# Patient Record
Sex: Female | Born: 1990 | Race: Asian | Hispanic: No | Marital: Single | Smoking: Never smoker
Health system: Southern US, Community
[De-identification: ages and names within clinical notes are randomized; demographics above are authoritative.]

## PROBLEM LIST (undated history)

## (undated) ENCOUNTER — Inpatient Hospital Stay (HOSPITAL_COMMUNITY): Payer: Self-pay

## (undated) DIAGNOSIS — K529 Noninfective gastroenteritis and colitis, unspecified: Secondary | ICD-10-CM

## (undated) DIAGNOSIS — R51 Headache: Secondary | ICD-10-CM

## (undated) HISTORY — DX: Headache: R51

## (undated) HISTORY — PX: WISDOM TOOTH EXTRACTION: SHX21

---
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
## 2008-01-24 ENCOUNTER — Emergency Department (HOSPITAL_COMMUNITY): Admission: EM | Admit: 2008-01-24 | Discharge: 2008-01-24 | Payer: Self-pay | Admitting: Emergency Medicine

## 2008-03-17 ENCOUNTER — Ambulatory Visit (HOSPITAL_COMMUNITY): Admission: RE | Admit: 2008-03-17 | Discharge: 2008-03-17 | Payer: Self-pay | Admitting: Family Medicine

## 2008-03-20 ENCOUNTER — Inpatient Hospital Stay (HOSPITAL_COMMUNITY): Admission: AD | Admit: 2008-03-20 | Discharge: 2008-03-23 | Payer: Self-pay | Admitting: Obstetrics and Gynecology

## 2008-03-20 ENCOUNTER — Encounter: Payer: Self-pay | Admitting: Obstetrics and Gynecology

## 2008-03-20 ENCOUNTER — Ambulatory Visit: Payer: Self-pay | Admitting: Obstetrics and Gynecology

## 2008-04-14 ENCOUNTER — Ambulatory Visit: Payer: Self-pay | Admitting: Obstetrics & Gynecology

## 2008-08-19 ENCOUNTER — Emergency Department (HOSPITAL_COMMUNITY): Admission: EM | Admit: 2008-08-19 | Discharge: 2008-08-19 | Payer: Self-pay | Admitting: Emergency Medicine

## 2009-02-04 ENCOUNTER — Emergency Department (HOSPITAL_COMMUNITY): Admission: EM | Admit: 2009-02-04 | Discharge: 2009-02-04 | Payer: Self-pay | Admitting: Emergency Medicine

## 2009-12-24 ENCOUNTER — Emergency Department (HOSPITAL_COMMUNITY): Admission: EM | Admit: 2009-12-24 | Discharge: 2009-12-25 | Payer: Self-pay | Admitting: Emergency Medicine

## 2010-06-23 ENCOUNTER — Emergency Department (HOSPITAL_COMMUNITY): Admission: EM | Admit: 2010-06-23 | Discharge: 2010-06-23 | Payer: Self-pay | Admitting: Emergency Medicine

## 2011-03-31 LAB — DIFFERENTIAL
Basophils Absolute: 0 10*3/uL (ref 0.0–0.1)
Lymphocytes Relative: 13 % (ref 12–46)
Lymphs Abs: 0.9 10*3/uL (ref 0.7–4.0)
Monocytes Relative: 9 % (ref 3–12)
Neutro Abs: 5.6 10*3/uL (ref 1.7–7.7)
Neutrophils Relative %: 78 % — ABNORMAL HIGH (ref 43–77)

## 2011-03-31 LAB — POCT I-STAT, CHEM 8
BUN: 6 mg/dL (ref 6–23)
Chloride: 107 mEq/L (ref 96–112)
HCT: 43 % (ref 36.0–46.0)
Potassium: 3.9 mEq/L (ref 3.5–5.1)

## 2011-03-31 LAB — URINALYSIS, ROUTINE W REFLEX MICROSCOPIC
Nitrite: NEGATIVE
Specific Gravity, Urine: 1.01 (ref 1.005–1.030)
Urobilinogen, UA: 0.2 mg/dL (ref 0.0–1.0)
pH: 6.5 (ref 5.0–8.0)

## 2011-03-31 LAB — CBC
Hemoglobin: 13.7 g/dL (ref 12.0–15.0)
Platelets: 210 10*3/uL (ref 150–400)
RBC: 4.77 MIL/uL (ref 3.87–5.11)

## 2011-03-31 LAB — POCT PREGNANCY, URINE: Preg Test, Ur: NEGATIVE

## 2011-05-13 NOTE — Group Therapy Note (Signed)
NAMEMAHAILA, TISCHER NO.:  [id number]   MEDICAL RECORD NO.:  [id number]          PATIENT TYPE:  WOC   LOCATION:  [facility]                   FACILITY:  [facility]   PHYSICIAN:  Sid Falcon, CNM  DATE OF BIRTH:  April 03, 1991   DATE OF SERVICE:  04/14/2008                                  CLINIC NOTE   The patient is an 20 year old gravida 1, para 1, status post lower  transverse cesarean section on March 18, 2008.  The patient shaved prior  to admission for cesarean section and developed a right labial  cellulitis.  The patient is here for check of this lesion site.  The  patient is breastfeeding, is not currently on birth control at this  time.  The patient has not been sexually active since discharge and  desires IUD for birth control.  She has a postpartum visit scheduled in  May.  The patient also complains of lightheadedness and also stated not  eating well at this time due to note being hungry and reports that there  is a resolution of the right labial cellulitis with no signs of  infection at this time.   PHYSICAL EXAMINATION:  GENERAL:  The patient was alert and oriented x3,  no acute signs of distress.  ABDOMEN:  Steri-Strips still in place, removed without difficulty.  Incisional site; no signs of infection.  No abnormal odor, no abnormal  discharge, and no redness.  Abdomen nontender with palpation.  The  patient reports that she has some incisional pain with increased  movement, however, bleeding is normal.  GENITOURINARY:  Right labia was assessed and appeared normal with no  redness and no edema.  No abnormal signs today.   ASSESSMENT:  Post wound check to lightheadedness.   PLAN:  The patient is advised to eat more frequent meals with breast  feeding and also informed that incisional pain is normal post cesarean  section.  She is to avoid heavy lifting and increased movement.  Allow  self approximately 6 weeks to heal status post cesarean  section.  Keep  postpartum appointment in May and avoid intercourse until that visit and  follow-up as needed.      Sid Falcon, CNM     WM/MEDQ  D:  04/14/2008  T:  04/14/2008  Job:  [id number]

## 2011-05-13 NOTE — Discharge Summary (Signed)
NAMEJHANAE, JASKOWIAK NO.:  [id number]   MEDICAL RECORD NO.:  [id number]           PATIENT TYPE:   LOCATION:                                [facility]   PHYSICIAN:  Phil D. Okey Dupre, M.D.     DATE OF BIRTH:  1991/12/14   DATE OF ADMISSION:  DATE OF DISCHARGE:                               DISCHARGE SUMMARY   The patient is an 20 year old G1, P0, admitted for spontaneous rupture  of membranes and onset of labor at 39 weeks and 3 days of estimated  gestational age.  However, a bedtime ultrasound showed that the baby was  in a breech position and so a primary low-transverse cesarean section  was performed.  The patient went on to have a viable female infant with  Apgars of 2 at 1 minute and 8 at 5 minutes under spinal anesthesia.  Estimated blood loss 600 mL.  The days following delivery, the patient  developed a right labial cellulitis, was started on Keflex 500 mg b.i.d.  Prior to discharge, the cellulitic area was draining on its own.  So a  wound culture was done and sent.  We will follow up with wound culture  and look for sensitivities and change antibiotics as necessary.  The  patient is breast feeding, using an IUD for contraception.  Significant  prenatal labs include blood type A+, antibody negative, hepatitis B  surface antigen negative, syphilis, HIV, GC and Chlamydia all negative,  GBS negative, rubella immune.  Prenatal procedures none.  Intrapartum  procedures include low cervical transverse cesarean.  Please see  operative report for details.  Postpartum procedures include  antibiotics, Keflex 500 mg 4 times daily. for right labial cellulitis.  Postpartum complications include a right labial cellulitis as mentioned  above.   DISCHARGE DIAGNOSIS:  Term pregnancy delivered and right labial  cellulitis.   DISCHARGE INFORMATION:  The patient was discharged on March 23, 2008, at  11:00 a.m.   ACTIVITY:  The patient was instructed to have no  intercourse for six  weeks, instructed not to lift anything greater than 10 pounds for six  weeks, and instructed not to drive for two weeks.   DIET:  Routine.   MEDICATIONS:  1. Percocet 5/325 mg 1 tab q.6 h. p.r.n. pain.  2. Ibuprofen 600 mg 1 tab q.6 h. p.r.n. pain.  3. Iron 325 mg 1 tab p.o. b.i.d. for anemia.  4. Colace 100 mg 1 tab p.o. b.i.d. as needed for constipation.  5. Prenatal vitamins 1 tab daily while breastfeeding.  6. keflex 500mg  4 times daily   Discharge status is well.  Discharge instructions are routine.  The  patient was discharged home and instructed to follow up on March 30, 2008, in the [facility], and for her cellulitis instructed to follow up  at 6 weeks at the health department.  Discharge hemoglobin 10.6,  discharge hematocrit 30.8 that was on March 21, 2008.  If not previously  mentioned the patient's admission date was March 20, 2008.      Asher Muir, MD  Phil D. Okey Dupre, M.D.  Electronically Signed    SO/MEDQ  D:  03/23/2008  T:  03/24/2008  Job:  [id number]

## 2011-05-13 NOTE — Group Therapy Note (Signed)
Julie Velasquez, KULIK NO.:  [id number]   MEDICAL RECORD NO.:  [id number]          PATIENT TYPE:  WOC   LOCATION:  [facility]                   FACILITY:  [facility]   PHYSICIAN:  Elsie Lincoln, MD      DATE OF BIRTH:  [birth year]   DATE OF SERVICE:                                  CLINIC NOTE   ADDENDUM:  The patient reported migraine history that she has had since  age 20.   PLAN:  1. The patient was given a referral to the [facility] for follow up.  2. A prescription was written for the lightheadedness of ferrous      sulfate 325 mg one p.o. daily no refills.      Eino Farber Jerolyn Center, CNM    ______________________________  Elsie Lincoln, MD    WM/MEDQ  D:  04/14/2008  T:  04/14/2008  Job:  [id number]

## 2011-05-13 NOTE — Op Note (Signed)
Julie Velasquez, Julie Velasquez                ACCOUNT NO.:  [id number]   MEDICAL RECORD NO.:  [id number]          PATIENT TYPE:  INP   LOCATION:  9132                          [facility]ACILITY:  [facility]   PHYSICIAN:  Phil D. Okey Dupre, M.D.     DATE OF BIRTH:  1991/11/25   DATE OF PROCEDURE:  03/20/2008  DATE OF DISCHARGE:                               OPERATIVE REPORT   PREOPERATIVE DIAGNOSES:  1. Intrauterine pregnancy at [redacted] weeks gestation.  2. Julie Velasquez breech presentation.  3. Spontaneous onset of labor with cervical dilatation to 6 cm.   POSTOPERATIVE DIAGNOSES:  1. Intrauterine pregnancy at [redacted] weeks gestation.  2. Julie Velasquez breech presentation.  3. Spontaneous onset of labor with cervical dilatation to 6 cm.   PROCEDURE:  Primary low transverse cesarean section.   SURGEON:  Bettye Boeck. Okey Dupre, MD.   ASSISTANT:  Karlton Lemon, MD.   ANESTHESIA:  Epidural.   FINDINGS:  1. Viable infant female weighing 7 pounds 4 ounces with Apgar's 2 at      one minute, 8 at two minutes, with cord pH of 7.26, in a frank      breech presentation.  2. Clear amniotic fluid.  3. Normal female pelvic anatomy.   ESTIMATED BLOOD LOSS:  600 ml.   DRAINS:  Foley with clear yellow urine.   COMPLICATIONS:  None immediate.   SPECIMENS:  1. Placenta to Labor and Delivery.  2. Cord pH to the Lab, which returned 7.26.  3. Cord blood to the Lab.   INDICATIONS FOR PROCEDURE:  Ms. Warsame is an 20 year old, gravida 1,  para 0, at [redacted] weeks gestation presenting with spontaneous onset of  labor.  After being admitted, the head could not be felt on cervical  examinations and ultrasound was performed.  She was found to be in frank  breech presentation.  The patient was in labor and was dilated to 6 cm  at the time of cesarean section.  The patient was taken to the operating  room for a primary low transverse cesarean section for frank breech  presentation while she was in labor.   DESCRIPTION OF PROCEDURE:  The patient was taken to the  operating room  and after obtaining adequate epidural anesthesia was prepped and draped  in the usual sterile manner in the supine position with left lateral  uterine displacement.  After insuring adequate anesthesia, a  Pfannenstiel skin incision was made using the scalpel.  This incision  was carried down through the subcutaneous tissues using the scalpel.  The rectus fascia was nicked in the midline and the incision was  extended laterally in each direction using the Mayo scissors.  The  rectus muscle was then dissected free of the fascia using both sharp and  blunt dissection.  The rectus muscles were separated with Mayo scissors  in the midline and bluntly.  The parietal peritoneum was identified,  grasped between hemostats, elevated, and entered inferiorly under direct  visualization.  A bladder blade was placed, and a reflection of the  visceral peritoneum superior to the bladder was identified and elevated  and incised  using the Metzenbaum scissors, and this incision was  extended laterally.  A bladder flap was created using blunt dissection  and retracted with the bladder blade.  A low transverse uterine incision  was made using the scalpel and the incision was extended laterally and  superiorly using blunt dissection.  A hand was placed within the uterine  cavity and the infant's buttocks were palpated.  The hand was used to  elevate the infant's buttocks, which were delivered without difficulty.  The buttocks were then rotated and the legs were delivered  atraumatically.  The torso was rotated to the mother's right, and the  infant's right arm was swept down over the front of the baby's body and  delivered.  The infant was then rotated to the left where the infant's  left arm was swept down in front of the infant's body and out.  The  infant's head was then flexed and with fundal pressure delivered  atraumatically.  The infant was bulb suctioned after delivery and the  cord was  clamped.  The cord was clamped x2, incised, and the infant was  handed to the [employer] in attendance.  The placenta was delivered  with cord traction and appeared intact.  The endometrial cavity was  wiped free of any traces of membranes using wet laparotomy sponges.  The  uterine incision was grasped with Allis clamps, and the uterine incision  was closed in one layer of running locking stitch of #0 Vicryl.  The  uterine incision was then inspected and found to have good hemostasis.  The rectus muscles were inspected, and small areas of bleeding were  controlled using the Bovie cautery.  The uterine incision was then  reinspected and found to have good hemostasis.  The rectus fascia was  reapproximated with one suture of #0 Vicryl in a running unlocked  fashion.  Small areas of bleeding in the subcutaneous tissue were  controlled using the Bovie cautery.  The skin was reapproximated with  stainless steel skin staples.  Sponge, needle, and instrument counts  were correct x2.  The patient tolerated the procedure well and went to  the postanesthesia care unit in stable condition.      Karlton Lemon, MD  Electronically Signed     ______________________________  Javier Glazier. Okey Dupre, M.D.    NS/MEDQ  D:  03/20/2008  T:  03/20/2008  Job:  [id number]

## 2011-08-19 ENCOUNTER — Emergency Department (HOSPITAL_COMMUNITY)
Admission: EM | Admit: 2011-08-19 | Discharge: 2011-08-20 | Disposition: A | Discharge status: Home / Self Care | Payer: Self-pay | Attending: Emergency Medicine | Admitting: Emergency Medicine

## 2011-08-19 DIAGNOSIS — R110 Nausea: Secondary | ICD-10-CM | POA: Insufficient documentation

## 2011-08-19 DIAGNOSIS — R000 Tachycardia, unspecified: Secondary | ICD-10-CM | POA: Insufficient documentation

## 2011-08-19 DIAGNOSIS — R1013 Epigastric pain: Secondary | ICD-10-CM | POA: Insufficient documentation

## 2011-08-19 LAB — COMPREHENSIVE METABOLIC PANEL
ALT: 14 U/L (ref 0–35)
Alkaline Phosphatase: 51 U/L (ref 39–117)
CO2: 24 mEq/L (ref 19–32)
Calcium: 9.7 mg/dL (ref 8.4–10.5)
Chloride: 101 mEq/L (ref 96–112)
Creatinine, Ser: 0.53 mg/dL (ref 0.50–1.10)
GFR calc Af Amer: >60 mL/min (ref >60)
GFR calc non Af Amer: >60 mL/min (ref >60)
Sodium: 134 mEq/L — ABNORMAL LOW (ref 135–145)
Total Bilirubin: 1.1 mg/dL (ref 0.3–1.2)

## 2011-08-19 LAB — DIFFERENTIAL
Basophils Relative: 0 % (ref 0–1)
Eosinophils Absolute: 0.1 10*3/uL (ref 0.0–0.7)
Eosinophils Relative: 1 % (ref 0–5)
Lymphocytes Relative: 5 % — ABNORMAL LOW (ref 12–46)
Lymphs Abs: 0.9 10*3/uL (ref 0.7–4.0)
Monocytes Absolute: 1 10*3/uL (ref 0.1–1.0)
Neutro Abs: 18.3 10*3/uL — ABNORMAL HIGH (ref 1.7–7.7)

## 2011-08-19 LAB — URINALYSIS, ROUTINE W REFLEX MICROSCOPIC
Bilirubin Urine: NEGATIVE
Glucose, UA: NEGATIVE mg/dL
Leukocytes, UA: NEGATIVE
Nitrite: NEGATIVE
Specific Gravity, Urine: 1.024 (ref 1.005–1.030)
Urobilinogen, UA: 0.2 mg/dL (ref 0.0–1.0)
pH: 6 (ref 5.0–8.0)

## 2011-08-19 LAB — LIPASE, BLOOD: Lipase: 18 U/L (ref 11–59)

## 2011-08-19 LAB — CBC
Hemoglobin: 13.6 g/dL (ref 12.0–15.0)
RBC: 4.85 MIL/uL (ref 3.87–5.11)
RDW: 13.4 % (ref 11.5–15.5)

## 2011-08-19 LAB — POCT PREGNANCY, URINE: Preg Test, Ur: NEGATIVE

## 2011-08-20 ENCOUNTER — Emergency Department (HOSPITAL_COMMUNITY): Payer: Self-pay

## 2011-08-20 MED ORDER — IOHEXOL 300 MG/ML  SOLN
80.0000 mL | Freq: Once | INTRAMUSCULAR | Status: AC | PRN
  Administered 2011-08-20: 80 mL via INTRAVENOUS

## 2011-09-18 LAB — CBC
HCT: 35.8 — ABNORMAL LOW
Hemoglobin: 12.1
Platelets: 299

## 2011-09-18 LAB — DIFFERENTIAL
Basophils Absolute: 0
Basophils Relative: 0
Eosinophils Relative: 4
Monocytes Absolute: 0.7
Monocytes Relative: 5
Neutrophils Relative %: 88 — ABNORMAL HIGH

## 2011-09-18 LAB — COMPREHENSIVE METABOLIC PANEL
ALT: 12
BUN: 10
Calcium: 9.2
Creatinine, Ser: 0.49
GFR calc Af Amer: >60
Glucose, Bld: 81
Potassium: 4.8
Sodium: 136
Total Protein: 6.6

## 2011-09-18 LAB — URINALYSIS, ROUTINE W REFLEX MICROSCOPIC
Bilirubin Urine: NEGATIVE
Glucose, UA: NEGATIVE
Specific Gravity, Urine: 1.017
pH: 7.5

## 2011-09-22 LAB — CBC
HCT: 30.8 — ABNORMAL LOW
HCT: 36.3
Hemoglobin: 12.4
MCHC: 34
MCV: 83.3
MCV: 83.6
Platelets: 242
RDW: 15.2
RDW: 15.3

## 2011-09-22 LAB — RPR: RPR Ser Ql: NONREACTIVE

## 2011-10-13 LAB — POCT PREGNANCY, URINE: Operator id: 247071

## 2011-12-30 NOTE — L&D Delivery Note (Signed)
Delivery Note Pt progressed to complete and pushed very well.  At 5:37 PM a viable female was delivered via VBAC, Spontaneous (Presentation: ; Occiput Anterior).  APGAR: 8, 9; weight pending.   Placenta status: Intact, Spontaneous.  Cord:  with the following complications: None.  Anesthesia: Epidural  Episiotomy: Median Lacerations: Labial Suture Repair: 3.0 vicryl for MLE, 3-0 Vicryl Rapide for left labia Est. Blood Loss (mL): 400  Mom to postpartum.  Baby to stay with mom.  Loray Akard D 10/07/2012, 6:04 PM

## 2012-04-05 LAB — OB RESULTS CONSOLE HEPATITIS B SURFACE ANTIGEN: Hepatitis B Surface Ag: NEGATIVE

## 2012-04-05 LAB — OB RESULTS CONSOLE GC/CHLAMYDIA
Chlamydia: NEGATIVE
Chlamydia: NEGATIVE
Gonorrhea: NEGATIVE
Gonorrhea: NEGATIVE

## 2012-04-05 LAB — OB RESULTS CONSOLE ANTIBODY SCREEN: Antibody Screen: NEGATIVE

## 2012-04-05 LAB — OB RESULTS CONSOLE HIV ANTIBODY (ROUTINE TESTING): HIV: NONREACTIVE

## 2012-04-05 LAB — OB RESULTS CONSOLE RUBELLA ANTIBODY, IGM: Rubella: IMMUNE

## 2012-04-19 DIAGNOSIS — O479 False labor, unspecified: Secondary | ICD-10-CM

## 2012-07-22 ENCOUNTER — Inpatient Hospital Stay (HOSPITAL_COMMUNITY)
Admission: AD | Admit: 2012-07-22 | Discharge: 2012-07-22 | Disposition: A | Discharge status: Home / Self Care | Payer: Medicaid Other | Source: Ambulatory Visit | Attending: Obstetrics and Gynecology | Admitting: Obstetrics and Gynecology

## 2012-07-22 ENCOUNTER — Encounter (HOSPITAL_COMMUNITY): Payer: Self-pay

## 2012-07-22 DIAGNOSIS — O4700 False labor before 37 completed weeks of gestation, unspecified trimester: Secondary | ICD-10-CM | POA: Insufficient documentation

## 2012-07-22 DIAGNOSIS — O479 False labor, unspecified: Secondary | ICD-10-CM

## 2012-07-22 NOTE — MAU Provider Note (Signed)
  History     CSN: [id number]  Arrival date and time: 07/22/12 1545   First Provider Initiated Contact with Patient 07/22/12 1639      Chief Complaint  Patient presents with  . Labor Eval  . Shortness of Breath   HPI Prima Rayner is 21 y.o. G2P1001 [redacted]w[redacted]d weeks presenting with called the office with report of contractions that became 5 days ago.  Has progressively worsened.  Patient of Dr. Berenda Morale.  Denies vaginal bleeding or discharge.  Baby is "very active".   Had glucose screening last week in the office.  This pregnancy has been complicated by nausea and vomiting and back pain.  Patient reports having intercourse in the past 24 hrs.    Past Medical History  Diagnosis Date  . No pertinent past medical history     Past Surgical History  Procedure Date  . Cesarean section     History reviewed. No pertinent family history.  History  Substance Use Topics  . Smoking status: Not on file  . Smokeless tobacco: Not on file  . Alcohol Use:     Allergies: No Known Allergies  Prescriptions prior to admission  Medication Sig Dispense Refill  . cyclobenzaprine (FLEXERIL) 5 MG tablet Take 5 mg by mouth at bedtime. Muscle spasms      . Prenatal Vit-Fe Fumarate-FA (PRENATAL MULTIVITAMIN) TABS Take 1 tablet by mouth daily.        Review of Systems  Constitutional: Negative for fever and chills.  Respiratory: Negative.   Cardiovascular: Negative.   Gastrointestinal: Positive for nausea (intermittent) and abdominal pain (contractions). Negative for vomiting.  Genitourinary:       Neg for vaginal bleeding or leaking of fluid   Physical Exam   Blood pressure 114/71, pulse 104, temperature 98.3 F (36.8 C), temperature source Oral, resp. rate 18, height 5' 0.5" (1.537 m), weight 62.778 kg (138 lb 6.4 oz), SpO2 98.00%.  Physical Exam  Constitutional: She is oriented to person, place, and time. She appears well-developed and well-nourished.       Patient appears comfortable  and is laughing.  HENT:  Head: Normocephalic.  Neck: Normal range of motion.  Cardiovascular: Normal rate.   Respiratory: Effort normal.  GI: There is no tenderness.       Tightening not palpated  Genitourinary:       Cervical exam by Massie Bougie, RN, closed, thick and high.  Neurological: She is alert and oriented to person, place, and time.  Skin: Skin is warm and dry.  Psychiatric: She has a normal mood and affect. Her behavior is normal.    MAU Course  Procedures  FMS- Reactive, baseline 135-140 with mod variability.  1 contraction noted at the beginning of the strip.  Occ irritability seen     FFN not sent because she had intercourse in the last 24 hrs and cervix is closed and thick MDM 16:52  Reported MSE to Dr. Ellyn Hack.    Assessment and Plan  A:  Braxton-Hicks Contractions at [redacted] wks gestation  P:  Importance of staying well hydrated      Keep scheduled appointment for continued prenatal care.  Burke Terry,EVE M 07/22/2012, 4:39 PM

## 2012-07-22 NOTE — MAU Note (Signed)
Patient has had a previous cesarean section but plans for a TOLAC.

## 2012-07-22 NOTE — MAU Note (Signed)
Pt states abdominal pain started Saturday and got worse today about 1400

## 2012-07-22 NOTE — MAU Note (Signed)
Patient states she is having contractions every 5 minutes. Denies any bleeding or leaking and reports good fetal movement. Patient state she is having shortness of breath with eating and having a hard time sleeping. Worse with exertion.

## 2012-09-07 ENCOUNTER — Encounter (HOSPITAL_COMMUNITY): Payer: Self-pay | Admitting: *Deleted

## 2012-09-07 ENCOUNTER — Inpatient Hospital Stay (HOSPITAL_COMMUNITY)
Admission: AD | Admit: 2012-09-07 | Discharge: 2012-09-07 | Disposition: A | Discharge status: Home / Self Care | Payer: Medicaid Other | Source: Ambulatory Visit | Attending: Obstetrics and Gynecology | Admitting: Obstetrics and Gynecology

## 2012-09-07 DIAGNOSIS — O99891 Other specified diseases and conditions complicating pregnancy: Secondary | ICD-10-CM | POA: Insufficient documentation

## 2012-09-07 DIAGNOSIS — K2960 Other gastritis without bleeding: Secondary | ICD-10-CM

## 2012-09-07 DIAGNOSIS — O26899 Other specified pregnancy related conditions, unspecified trimester: Secondary | ICD-10-CM

## 2012-09-07 DIAGNOSIS — R109 Unspecified abdominal pain: Secondary | ICD-10-CM | POA: Insufficient documentation

## 2012-09-07 DIAGNOSIS — Z3490 Encounter for supervision of normal pregnancy, unspecified, unspecified trimester: Secondary | ICD-10-CM

## 2012-09-07 LAB — URINALYSIS, ROUTINE W REFLEX MICROSCOPIC
Leukocytes, UA: NEGATIVE
Nitrite: NEGATIVE
Specific Gravity, Urine: 1.02 (ref 1.005–1.030)
pH: 6 (ref 5.0–8.0)

## 2012-09-07 MED ORDER — RANITIDINE HCL 150 MG PO TABS: 150.0000 mg | ORAL_TABLET | Freq: Two times a day (BID) | ORAL | Status: DC

## 2012-09-07 MED ORDER — GI COCKTAIL ~~LOC~~
30.0000 mL | Freq: Once | ORAL | Status: AC
  Administered 2012-09-07: 30 mL via ORAL
  Filled 2012-09-07: qty 30

## 2012-09-07 NOTE — MAU Provider Note (Signed)
History     CSN: [id number]  Arrival date and time: 09/07/12 0203   First Provider Initiated Contact with Patient 09/07/12 0226      Chief Complaint  Patient presents with  . Abdominal Pain   HPI Julie Velasquez is a 21 y.o. female @ [redacted]w[redacted]d gestation who presents to MAU with abdominal pain. The pain is located in the epigastric area.  She has had the same pain off and on for the past month. Tonight while trying to sleep the symptoms increased. She felt like she couldn't breath. Sitting straight up makes the symptoms better.  She has discussed the problem with her OB and they told her it was normal as the pregnancy progressed. Tonight vomited after eating. States she feels like when she eats her food stays up in her throat. Complains of continued nausea.  She denies vaginal bleeding, leaking of fluid or discharge. She has had lower back pain that radiates to the lower abdomen.   OB History    Grav Para Term Preterm Abortions TAB SAB Ect Mult Living   2 1 1  0 0 0 0 0 0 1      Past Medical History  Diagnosis Date  . No pertinent past medical history     Past Surgical History  Procedure Date  . Cesarean section     History reviewed. No pertinent family history.  History  Substance Use Topics  . Smoking status: Not on file  . Smokeless tobacco: Not on file  . Alcohol Use:     Allergies: No Known Allergies  Prescriptions prior to admission  Medication Sig Dispense Refill  . Prenatal Vit-Fe Fumarate-FA (PRENATAL MULTIVITAMIN) TABS Take 1 tablet by mouth daily.      . cyclobenzaprine (FLEXERIL) 5 MG tablet Take 5 mg by mouth at bedtime. Muscle spasms        Review of Systems  Constitutional: Negative for fever, chills and weight loss.  HENT: Negative for ear pain, nosebleeds, congestion, sore throat and neck pain.   Eyes: Negative for blurred vision, double vision, photophobia and pain.  Respiratory: Negative for cough, shortness of breath and wheezing.   Cardiovascular:  Negative for chest pain, palpitations and leg swelling.  Gastrointestinal: Positive for heartburn, nausea, vomiting and abdominal pain (epigastric pain). Negative for diarrhea and constipation.  Genitourinary: Positive for frequency. Negative for dysuria and urgency.  Musculoskeletal: Positive for back pain. Negative for myalgias.  Skin: Negative for itching and rash.  Neurological: Negative for dizziness, sensory change, speech change, seizures, weakness and headaches.  Endo/Heme/Allergies: Does not bruise/bleed easily.  Psychiatric/Behavioral: Negative for depression. The patient is not nervous/anxious and does not have insomnia.    Physical Exam   Blood pressure 104/69, pulse 75, temperature 98.3 F (36.8 C), temperature source Oral, resp. rate 20, height 5\' 1"  (1.549 m), weight 144 lb (65.318 kg), SpO2 100.00%.  Physical Exam  Nursing note and vitals reviewed. Constitutional: She is oriented to person, place, and time. She appears well-developed and well-nourished. No distress.  HENT:  Head: Normocephalic and atraumatic.  Eyes: EOM are normal.  Neck: Neck supple.  Cardiovascular: Normal rate.   Respiratory: Effort normal and breath sounds normal. No respiratory distress.  GI: Soft. There is tenderness in the epigastric area. There is no rigidity, no rebound, no guarding and no CVA tenderness.  Genitourinary:       Dilation: Closed Effacement (%): Thick Station: -2 Presentation: Undeterminable Exam by:: L. Munford RN  Musculoskeletal: Normal range of motion.  Neurological:  She is alert and oriented to person, place, and time.  Skin: Skin is warm and dry.  Psychiatric: She has a normal mood and affect. Her behavior is normal. Judgment and thought content normal.    MAU Course: discussed with Dr. Ambrose Mantle. Will have patient start zantac and follow up in the office. If symptoms worsen will refer to GI.  Procedures EFM:  Baseline 135, no decelerations, reactive  tracing  Medication List  As of 09/07/2012  3:35 AM   START taking these medications         ranitidine 150 MG tablet   Commonly known as: ZANTAC   Take 1 tablet (150 mg total) by mouth 2 (two) times daily.         CONTINUE taking these medications         cyclobenzaprine 5 MG tablet   Commonly known as: FLEXERIL      prenatal multivitamin Tabs          Where to get your medications    These are the prescriptions that you need to pick up.   You may get these medications from any pharmacy.         ranitidine 150 MG tablet          Discussed with the patient and all questioned fully answered. She will follow up with Dr. Senaida Ores or return here if any problems arise. Follow-up Information    Follow up with Oliver Pila, MD.   Contact information:   [address] [place] [address] [phone]         Kerrie Buffalo, RN, FNP, [facility] 09/07/2012, 2:26 AM

## 2012-09-07 NOTE — MAU Note (Signed)
Pt reports pain in upper abd "for a while", had reported same to MD.states she is tired and unable to sleep. Vomiting sometimes. States it is hard for her to breathe when she is laying down.

## 2012-09-10 LAB — OB RESULTS CONSOLE GBS: GBS: NEGATIVE

## 2012-09-18 ENCOUNTER — Encounter (HOSPITAL_COMMUNITY): Payer: Self-pay

## 2012-09-18 ENCOUNTER — Inpatient Hospital Stay (HOSPITAL_COMMUNITY)
Admission: AD | Admit: 2012-09-18 | Discharge: 2012-09-18 | Disposition: A | Discharge status: Home / Self Care | Payer: Medicaid Other | Source: Ambulatory Visit | Attending: Obstetrics and Gynecology | Admitting: Obstetrics and Gynecology

## 2012-09-18 DIAGNOSIS — O479 False labor, unspecified: Secondary | ICD-10-CM | POA: Insufficient documentation

## 2012-09-18 DIAGNOSIS — O34219 Maternal care for unspecified type scar from previous cesarean delivery: Secondary | ICD-10-CM | POA: Insufficient documentation

## 2012-09-18 NOTE — MAU Note (Signed)
Onset of contractions earlier used the bathroom and wiped noticed blood on tissue G2P1 [redacted]w[redacted]d, previous C/S for breech plans VBAC

## 2012-09-18 NOTE — Progress Notes (Signed)
FHT from this am reviewed.  Reactive NST, irreg ctx. 

## 2012-10-05 ENCOUNTER — Telehealth (HOSPITAL_COMMUNITY): Payer: Self-pay | Admitting: *Deleted

## 2012-10-05 ENCOUNTER — Encounter (HOSPITAL_COMMUNITY): Payer: Self-pay | Admitting: *Deleted

## 2012-10-05 NOTE — Telephone Encounter (Signed)
Preadmission screen  

## 2012-10-06 ENCOUNTER — Telehealth (HOSPITAL_COMMUNITY): Payer: Self-pay | Admitting: *Deleted

## 2012-10-06 ENCOUNTER — Encounter (HOSPITAL_COMMUNITY): Payer: Self-pay | Admitting: *Deleted

## 2012-10-06 NOTE — Telephone Encounter (Signed)
Preadmission screen  

## 2012-10-07 ENCOUNTER — Encounter (HOSPITAL_COMMUNITY): Payer: Self-pay | Admitting: Anesthesiology

## 2012-10-07 ENCOUNTER — Encounter (HOSPITAL_COMMUNITY): Payer: Self-pay | Admitting: Obstetrics and Gynecology

## 2012-10-07 ENCOUNTER — Inpatient Hospital Stay (HOSPITAL_COMMUNITY): Payer: Medicaid Other | Admitting: Anesthesiology

## 2012-10-07 ENCOUNTER — Encounter (HOSPITAL_COMMUNITY): Payer: Self-pay | Admitting: *Deleted

## 2012-10-07 ENCOUNTER — Inpatient Hospital Stay (HOSPITAL_COMMUNITY)
Admission: AD | Admit: 2012-10-07 | Discharge: 2012-10-09 | DRG: 775 | Disposition: A | Discharge status: Home / Self Care | Payer: Medicaid Other | Source: Ambulatory Visit | Attending: Obstetrics and Gynecology | Admitting: Obstetrics and Gynecology

## 2012-10-07 DIAGNOSIS — IMO0001 Reserved for inherently not codable concepts without codable children: Secondary | ICD-10-CM

## 2012-10-07 DIAGNOSIS — O34219 Maternal care for unspecified type scar from previous cesarean delivery: Secondary | ICD-10-CM | POA: Diagnosis present

## 2012-10-07 DIAGNOSIS — Z98891 History of uterine scar from previous surgery: Secondary | ICD-10-CM

## 2012-10-07 LAB — CBC
HCT: 34.9 % — ABNORMAL LOW (ref 36.0–46.0)
Hemoglobin: 11.5 g/dL — ABNORMAL LOW (ref 12.0–15.0)
Hemoglobin: 10.7 g/dL — ABNORMAL LOW (ref 12.0–15.0)
MCH: 26.6 pg (ref 26.0–34.0)
MCHC: 33 g/dL (ref 30.0–36.0)
MCV: 81.8 fL (ref 78.0–100.0)
RBC: 4.28 MIL/uL (ref 3.87–5.11)
RBC: 4.02 MIL/uL (ref 3.87–5.11)
WBC: 8.7 10*3/uL (ref 4.0–10.5)

## 2012-10-07 LAB — RPR: RPR Ser Ql: NONREACTIVE

## 2012-10-07 MED ORDER — OXYTOCIN 40 UNITS IN LACTATED RINGERS INFUSION - SIMPLE MED
62.5000 mL/h | Freq: Once | INTRAVENOUS | Status: DC
  Filled 2012-10-07: qty 1000

## 2012-10-07 MED ORDER — DIBUCAINE 1 % RE OINT: 1.0000 "application " | TOPICAL_OINTMENT | RECTAL | Status: DC | PRN

## 2012-10-07 MED ORDER — EPHEDRINE 5 MG/ML INJ
10.0000 mg | INTRAVENOUS | Status: DC | PRN
  Filled 2012-10-07: qty 4

## 2012-10-07 MED ORDER — SIMETHICONE 80 MG PO CHEW: 80.0000 mg | CHEWABLE_TABLET | ORAL | Status: DC | PRN

## 2012-10-07 MED ORDER — ONDANSETRON HCL 4 MG PO TABS: 4.0000 mg | ORAL_TABLET | ORAL | Status: DC | PRN

## 2012-10-07 MED ORDER — PHENYLEPHRINE 40 MCG/ML (10ML) SYRINGE FOR IV PUSH (FOR BLOOD PRESSURE SUPPORT): 80.0000 ug | PREFILLED_SYRINGE | INTRAVENOUS | Status: DC | PRN

## 2012-10-07 MED ORDER — LACTATED RINGERS IV SOLN
500.0000 mL | Freq: Once | INTRAVENOUS | Status: AC
  Administered 2012-10-07: 500 mL via INTRAVENOUS

## 2012-10-07 MED ORDER — METHYLERGONOVINE MALEATE 0.2 MG PO TABS: 0.2000 mg | ORAL_TABLET | ORAL | Status: DC | PRN

## 2012-10-07 MED ORDER — OXYCODONE-ACETAMINOPHEN 5-325 MG PO TABS: 1.0000 | ORAL_TABLET | ORAL | Status: DC | PRN

## 2012-10-07 MED ORDER — FENTANYL 2.5 MCG/ML BUPIVACAINE 1/10 % EPIDURAL INFUSION (WH - ANES)
14.0000 mL/h | INTRAMUSCULAR | Status: DC
  Administered 2012-10-07: 14 mL/h via EPIDURAL
  Filled 2012-10-07 (×2): qty 125

## 2012-10-07 MED ORDER — ONDANSETRON HCL 4 MG/2ML IJ SOLN: 4.0000 mg | Freq: Four times a day (QID) | INTRAMUSCULAR | Status: DC | PRN

## 2012-10-07 MED ORDER — DIPHENHYDRAMINE HCL 25 MG PO CAPS: 25.0000 mg | ORAL_CAPSULE | Freq: Four times a day (QID) | ORAL | Status: DC | PRN

## 2012-10-07 MED ORDER — DIPHENHYDRAMINE HCL 50 MG/ML IJ SOLN: 12.5000 mg | INTRAMUSCULAR | Status: DC | PRN

## 2012-10-07 MED ORDER — MEASLES, MUMPS & RUBELLA VAC ~~LOC~~ INJ: 0.5000 mL | INJECTION | Freq: Once | SUBCUTANEOUS | Status: DC

## 2012-10-07 MED ORDER — BENZOCAINE-MENTHOL 20-0.5 % EX AERO
1.0000 "application " | INHALATION_SPRAY | CUTANEOUS | Status: DC | PRN
  Administered 2012-10-08: 1 via TOPICAL
  Filled 2012-10-07: qty 56

## 2012-10-07 MED ORDER — LANOLIN HYDROUS EX OINT: TOPICAL_OINTMENT | CUTANEOUS | Status: DC | PRN

## 2012-10-07 MED ORDER — IBUPROFEN 600 MG PO TABS
600.0000 mg | ORAL_TABLET | Freq: Four times a day (QID) | ORAL | Status: DC | PRN
  Administered 2012-10-07: 600 mg via ORAL
  Filled 2012-10-07: qty 1

## 2012-10-07 MED ORDER — LACTATED RINGERS IV SOLN
INTRAVENOUS | Status: DC
  Administered 2012-10-07 (×3): via INTRAVENOUS

## 2012-10-07 MED ORDER — ZOLPIDEM TARTRATE 5 MG PO TABS: 5.0000 mg | ORAL_TABLET | Freq: Every evening | ORAL | Status: DC | PRN

## 2012-10-07 MED ORDER — FENTANYL 2.5 MCG/ML BUPIVACAINE 1/10 % EPIDURAL INFUSION (WH - ANES)
INTRAMUSCULAR | Status: DC | PRN
  Administered 2012-10-07: 14 mL/h via EPIDURAL

## 2012-10-07 MED ORDER — MAGNESIUM HYDROXIDE 400 MG/5ML PO SUSP: 30.0000 mL | ORAL | Status: DC | PRN

## 2012-10-07 MED ORDER — OXYCODONE-ACETAMINOPHEN 5-325 MG PO TABS
1.0000 | ORAL_TABLET | ORAL | Status: DC | PRN
  Administered 2012-10-08 – 2012-10-09 (×7): 1 via ORAL
  Filled 2012-10-07 (×7): qty 1

## 2012-10-07 MED ORDER — EPHEDRINE 5 MG/ML INJ: 10.0000 mg | INTRAVENOUS | Status: DC | PRN

## 2012-10-07 MED ORDER — OXYTOCIN BOLUS FROM INFUSION
500.0000 mL | Freq: Once | INTRAVENOUS | Status: AC
  Administered 2012-10-07: 500 mL via INTRAVENOUS
  Filled 2012-10-07: qty 500

## 2012-10-07 MED ORDER — LACTATED RINGERS IV SOLN
500.0000 mL | INTRAVENOUS | Status: DC | PRN
  Administered 2012-10-07: 300 mL via INTRAVENOUS

## 2012-10-07 MED ORDER — PRENATAL MULTIVITAMIN CH
1.0000 | ORAL_TABLET | Freq: Every day | ORAL | Status: DC
  Administered 2012-10-08 – 2012-10-09 (×2): 1 via ORAL
  Filled 2012-10-07 (×2): qty 1

## 2012-10-07 MED ORDER — ACETAMINOPHEN 325 MG PO TABS: 650.0000 mg | ORAL_TABLET | ORAL | Status: DC | PRN

## 2012-10-07 MED ORDER — METHYLERGONOVINE MALEATE 0.2 MG/ML IJ SOLN: 0.2000 mg | INTRAMUSCULAR | Status: DC | PRN

## 2012-10-07 MED ORDER — SENNOSIDES-DOCUSATE SODIUM 8.6-50 MG PO TABS
2.0000 | ORAL_TABLET | Freq: Every day | ORAL | Status: DC
  Administered 2012-10-07 – 2012-10-08 (×2): 2 via ORAL

## 2012-10-07 MED ORDER — WITCH HAZEL-GLYCERIN EX PADS: 1.0000 "application " | MEDICATED_PAD | CUTANEOUS | Status: DC | PRN

## 2012-10-07 MED ORDER — TETANUS-DIPHTH-ACELL PERTUSSIS 5-2.5-18.5 LF-MCG/0.5 IM SUSP: 0.5000 mL | Freq: Once | INTRAMUSCULAR | Status: DC

## 2012-10-07 MED ORDER — PHENYLEPHRINE 40 MCG/ML (10ML) SYRINGE FOR IV PUSH (FOR BLOOD PRESSURE SUPPORT)
80.0000 ug | PREFILLED_SYRINGE | INTRAVENOUS | Status: DC | PRN
  Filled 2012-10-07: qty 5

## 2012-10-07 MED ORDER — CITRIC ACID-SODIUM CITRATE 334-500 MG/5ML PO SOLN
30.0000 mL | ORAL | Status: DC | PRN
  Administered 2012-10-07: 30 mL via ORAL
  Filled 2012-10-07: qty 15

## 2012-10-07 MED ORDER — IBUPROFEN 600 MG PO TABS
600.0000 mg | ORAL_TABLET | Freq: Four times a day (QID) | ORAL | Status: DC
  Administered 2012-10-08 – 2012-10-09 (×6): 600 mg via ORAL
  Filled 2012-10-07 (×8): qty 1

## 2012-10-07 MED ORDER — LIDOCAINE HCL (PF) 1 % IJ SOLN
30.0000 mL | INTRAMUSCULAR | Status: DC | PRN
  Filled 2012-10-07: qty 30

## 2012-10-07 MED ORDER — LIDOCAINE HCL (PF) 1 % IJ SOLN
INTRAMUSCULAR | Status: DC | PRN
  Administered 2012-10-07 (×2): 9 mL

## 2012-10-07 MED ORDER — ONDANSETRON HCL 4 MG/2ML IJ SOLN: 4.0000 mg | INTRAMUSCULAR | Status: DC | PRN

## 2012-10-07 NOTE — H&P (Signed)
Julie Velasquez is a 21 y.o. female, G2 P1001, EGA 39+ weeks with EDC 10-11 presenting for eval of reg ctx.  On eval in MAU, ctx q 5-7 min, VE 3-4 cm, pt very uncomfortable.  She was admitted and has received an epidural.  Previous cesarean section for breech, CTOL and desires VBAC.  See prenatal records for complete history.  Maternal Medical History:  Reason for admission: Reason for admission: contractions.  Contractions: Frequency: regular.   Perceived severity is strong.    Fetal activity: Perceived fetal activity is normal.      OB History    Grav Para Term Preterm Abortions TAB SAB Ect Mult Living   2 1 1  0 0 0 0 0 0 1    LTCS with one layer closure at term for breech  Past Medical History  Diagnosis Date  . No pertinent past medical history   . Headache     migraines   Past Surgical History  Procedure Date  . Cesarean section   . Wisdom tooth extraction    Family History: family history includes Urolithiasis in her mother. Social History:  reports that she has never smoked. She has never used smokeless tobacco. She reports that she does not drink alcohol or use illicit drugs.   Prenatal Transfer Tool  Maternal Diabetes: No Genetic Screening: Normal Maternal Ultrasounds/Referrals: Normal Fetal Ultrasounds or other Referrals:  None Maternal Substance Abuse:  No Significant Maternal Medications:  None Significant Maternal Lab Results:  Lab values include: Group B Strep negative Other Comments:  None  Review of Systems  Respiratory: Negative.   Cardiovascular: Negative.    AROM clear Dilation: 6 Effacement (%): 90 Station: 0 Exam by:: Nasim Garofano, MD Blood pressure 103/64, pulse 80, temperature 98.3 F (36.8 C), temperature source Oral, resp. rate 18, height 5\' 1"  (1.549 m), weight 65.318 kg (144 lb), SpO2 99.00%. Maternal Exam:  Uterine Assessment: Contraction strength is firm.  Contraction frequency is regular.   Abdomen: Patient reports no abdominal  tenderness. Surgical scars: low transverse.   Estimated fetal weight is 7 lbs.   Fetal presentation: vertex  Introitus: Normal vulva. Normal vagina.  Amniotic fluid character: clear.  Pelvis: adequate for delivery.   Cervix: Cervix evaluated by digital exam.     Fetal Exam Fetal Monitor Review: Mode: ultrasound.   Baseline rate: 130.  Variability: moderate (6-25 bpm).   Pattern: accelerations present and no decelerations.    Fetal State Assessment: Category I - tracings are normal.     Physical Exam  Constitutional: She appears well-developed and well-nourished.  Cardiovascular: Normal rate, regular rhythm and normal heart sounds.   No murmur heard. Respiratory: Effort normal. No respiratory distress. She has no wheezes.  GI: Soft.       Gravid     Prenatal labs: ABO, Rh: --/--/A POS (10/10 4098) Antibody: NEG (10/10 0822) Rubella: Immune (04/08 0000) RPR: Nonreactive, Nonreactive (04/08 0000)  HBsAg: Negative (04/08 0000)  HIV: Non-reactive, Non-reactive (04/08 0000)  GBS: Negative (09/13 0000)  GCT:  102  Assessment/Plan: IUP at 39+ weeks in active labor, previous cesarean section, CTOL and wants VBAC.  Making adequate progress, monitor and anticipate SVD.     Terion Hedman D 10/07/2012, 1:25 PM

## 2012-10-07 NOTE — Anesthesia Procedure Notes (Signed)
Epidural Patient location during procedure: OB Start time: 10/07/2012 9:52 AM End time: 10/07/2012 9:57 AM  Staffing Anesthesiologist: Sandrea Hughs Performed by: anesthesiologist   Preanesthetic Checklist Completed: patient identified, site marked, surgical consent, pre-op evaluation, timeout performed, IV checked, risks and benefits discussed and monitors and equipment checked  Epidural Patient position: sitting Prep: site prepped and draped and DuraPrep Patient monitoring: continuous pulse ox and blood pressure Approach: midline Injection technique: LOR air  Needle:  Needle type: Tuohy  Needle gauge: 17 G Needle length: 9 cm and 9 Needle insertion depth: 4 cm Catheter type: closed end flexible Catheter size: 19 Gauge Catheter at skin depth: 9 cm Test dose: negative and Other  Assessment Sensory level: T8 Events: blood not aspirated, injection not painful, no injection resistance, negative IV test and no paresthesia  Additional Notes Reason for block:procedure for pain

## 2012-10-07 NOTE — MAU Note (Signed)
Pt states she started having intense u/c's this am about 0700.  Pt is a VBAC, primary C/S for breech presentation.  Denies vaginal bleeding, ROM and good fetal movement.

## 2012-10-07 NOTE — MAU Note (Signed)
Patient brought directly back to room 6 from lobby via wheelchair, G2P1 40 weeks previous C/S, desires VBAC, onset of labor around 6:45

## 2012-10-07 NOTE — Anesthesia Preprocedure Evaluation (Signed)
Anesthesia Evaluation  Patient identified by MRN, date of birth, ID band Patient awake    Reviewed: Allergy & Precautions, H&P , NPO status , Patient's Chart, lab work & pertinent test results  Airway  TM Distance: >3 FB Neck ROM: full    Dental No notable dental hx.    Pulmonary neg pulmonary ROS,    Pulmonary exam normal       Cardiovascular negative cardio ROS      Neuro/Psych negative psych ROS   GI/Hepatic negative GI ROS, Neg liver ROS,   Endo/Other  negative endocrine ROS  Renal/GU negative Renal ROS  negative genitourinary   Musculoskeletal negative musculoskeletal ROS (+)   Abdominal Normal abdominal exam  (+)   Peds negative pediatric ROS (+)  Hematology negative hematology ROS (+)   Anesthesia Other Findings   Reproductive/Obstetrics (+) Pregnancy                           Anesthesia Physical Anesthesia Plan  ASA: II  Anesthesia Plan: Epidural   Post-op Pain Management:    Induction:   Airway Management Planned:   Additional Equipment:   Intra-op Plan:   Post-operative Plan:   Informed Consent: I have reviewed the patients History and Physical, chart, labs and discussed the procedure including the risks, benefits and alternatives for the proposed anesthesia with the patient or authorized representative who has indicated his/her understanding and acceptance.     Plan Discussed with:   Anesthesia Plan Comments:         Anesthesia Quick Evaluation

## 2012-10-08 LAB — CBC
HCT: 27.9 % — ABNORMAL LOW (ref 36.0–46.0)
Hemoglobin: 9.3 g/dL — ABNORMAL LOW (ref 12.0–15.0)
RBC: 3.39 MIL/uL — ABNORMAL LOW (ref 3.87–5.11)
RDW: 15 % (ref 11.5–15.5)
WBC: 12.8 10*3/uL — ABNORMAL HIGH (ref 4.0–10.5)

## 2012-10-08 MED ORDER — INFLUENZA VIRUS VACC SPLIT PF IM SUSP: 0.5000 mL | INTRAMUSCULAR | Status: DC

## 2012-10-08 NOTE — Progress Notes (Signed)
UR chart review completed.  

## 2012-10-08 NOTE — Anesthesia Postprocedure Evaluation (Signed)
  Anesthesia Post-op Note  Patient: Julie Velasquez  Procedure(s) Performed: * No procedures listed *  Patient Location: Mother/Baby  Anesthesia Type: Epidural  Level of Consciousness: awake, alert  and oriented  Airway and Oxygen Therapy: Patient Spontanous Breathing  Post-op Pain: none  Post-op Assessment: Post-op Vital signs reviewed and Patient's Cardiovascular Status Stable  Post-op Vital Signs: Reviewed and stable  Complications: No apparent anesthesia complications

## 2012-10-08 NOTE — Progress Notes (Signed)
PPD #1 VBAC No problems Afeb, VSS Fundus firm, NT at U-1 Hgb 11.5 to 9.3 Continue routine postpartum care

## 2012-10-09 MED ORDER — OXYCODONE-ACETAMINOPHEN 5-325 MG PO TABS: 1.0000 | ORAL_TABLET | Freq: Four times a day (QID) | ORAL | Status: AC | PRN

## 2012-10-09 MED ORDER — IBUPROFEN 600 MG PO TABS: 600.0000 mg | ORAL_TABLET | Freq: Four times a day (QID) | ORAL | Status: AC | PRN

## 2012-10-09 MED ORDER — FERROUS SULFATE 300 (60 FE) MG PO TABS: 300.0000 mg | ORAL_TABLET | Freq: Two times a day (BID) | ORAL | Status: AC

## 2012-10-09 NOTE — Progress Notes (Signed)
Patient ID: Julie Velasquez, female   DOB: October 15, 1991, 21 y.o.   MRN: [id number] #2 afebrile BP normal no complaints

## 2012-10-09 NOTE — Discharge Summary (Signed)
[name], Julie Velasquez NO.:  [id number]  MEDICAL RECORD NO.:  [id number]  LOCATION:  9119                          FACILITY:  [facility]  PHYSICIAN:  Malachi Pro. Ambrose Mantle, M.D. DATE OF BIRTH:  Nov 07, 1991  DATE OF ADMISSION:  10/07/2012 DATE OF DISCHARGE:                              DISCHARGE SUMMARY   PRESENT ILLNESS:  This is a 21 year old Asian female, para 1-0-0-1, gravida 2, [facility] October 08, 2012 presented for evaluation of regular contractions.  Cervix is 3-4 cm dilated.  The patient was very uncomfortable.  She was admitted and then received an epidural.  She had had a previous C-section for breech and she was considered compatible with a trial of labor.  Blood group and type A positive, negative antibody, rubella immune, RPR nonreactive, hepatitis B surface antigen negative, HIV negative, GBS negative.  One hour Glucola 102.  The patient progressed to complete dilatation and pushed very well and delivered a living female infant spontaneously OA over a midline episiotomy with bilateral labial lacerations.  Apgars were 8 and 9 at 1 and 5 minutes, 3-0 Vicryl for the midline episiotomy and 3-0 Vicryl Rapide for the left labial laceration.  Blood loss was about 400 mL. Postpartum the patient did well and was discharged on the second postpartum day.  Initial hemoglobin 11.5, hematocrit 34.9, white count 8700, platelet count 181,000.  Followup hemoglobin 10.7, hematocrit 32.9, and final hemoglobin 9.3, hematocrit 27.9, platelet count 183,000. RPR was nonreactive.  FINAL DIAGNOSES:  Intrauterine pregnancy at 39+ weeks, delivered occiput anterior.  Vaginal birth after cesarean.  OPERATION:  Spontaneous delivery OA, repair of midline episiotomy and left labial laceration.  FINAL CONDITION:  Improved.  Instructions include our discharge instruction booklet, the after visit summary, prescriptions for Motrin 600 mg, 30 tablets, 1 every 6 hours as needed for pain and Percocet  5/325, 30 tablets, 1 every 6 hours as needed for pain as well as ferrous sulfate 300 mg, #60, 1 twice a day. The patient is advised to return to the office in 6 weeks for followup examination.     Malachi Pro. Ambrose Mantle, M.D.     TFH/MEDQ  D:  10/09/2012  T:  10/09/2012  Job:  [id number]

## 2012-10-10 LAB — TYPE AND SCREEN
ABO/RH(D): A POS
Antibody Screen: NEGATIVE
Unit division: 0
Unit division: 0

## 2012-10-11 ENCOUNTER — Inpatient Hospital Stay (HOSPITAL_COMMUNITY): Admission: RE | Admit: 2012-10-11 | Payer: Medicaid Other | Source: Ambulatory Visit

## 2013-06-02 IMAGING — CT CT ABD-PELV W/ CM
2 of 4 series · 17 of 46 positions shown, 19 images · IV contrast (water/omni)
Comparison: CT of the abdomen and pelvis performed 09/01/2006

CLINICAL DATA: Epigastric abdominal pain, nausea and leukocytosis.

CT ABDOMEN AND PELVIS WITH CONTRAST
TECHNIQUE: Multidetector CT imaging of the abdomen and pelvis was
performed following the standard protocol during bolus
administration of intravenous contrast.
Contrast: 80 mL of Omnipaque 300 IV contrast

[Series 2: routine abdomen · axial · 0.70mm/px · z∈[-433,-53]mm · 14 of 84 slices shown, 16 images]
[im 4/84  soft-tissue]
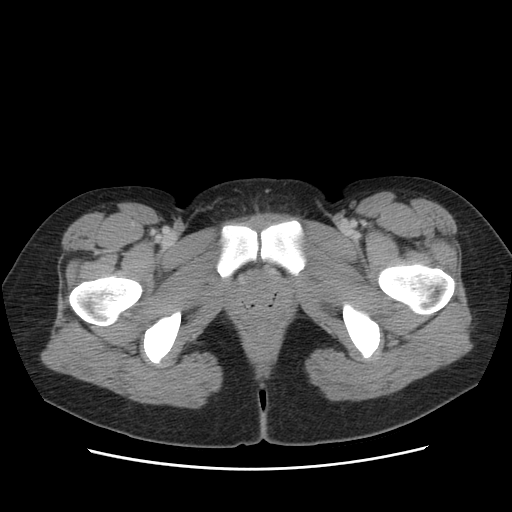
[im 4/84  bone]
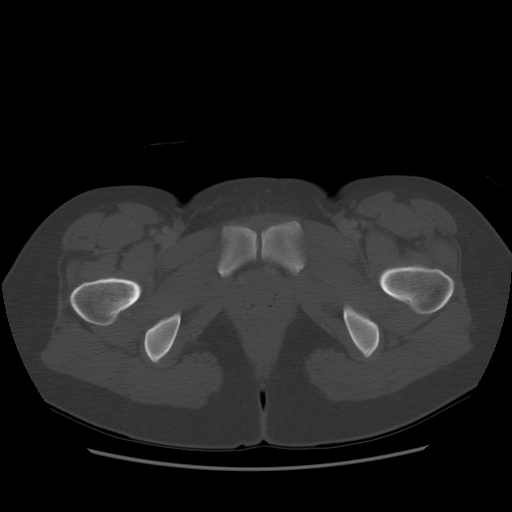
[im 11/84  soft-tissue]
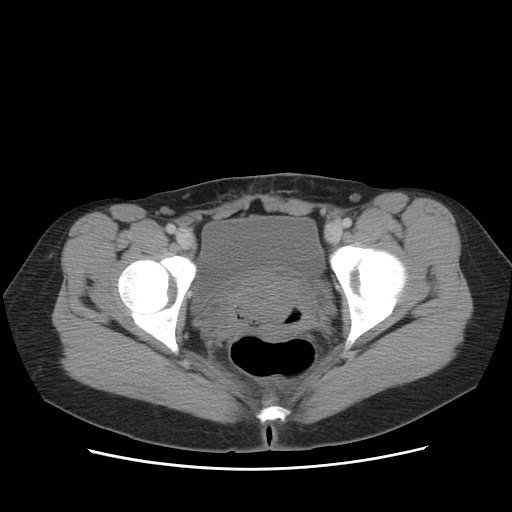
[im 15/84  soft-tissue]
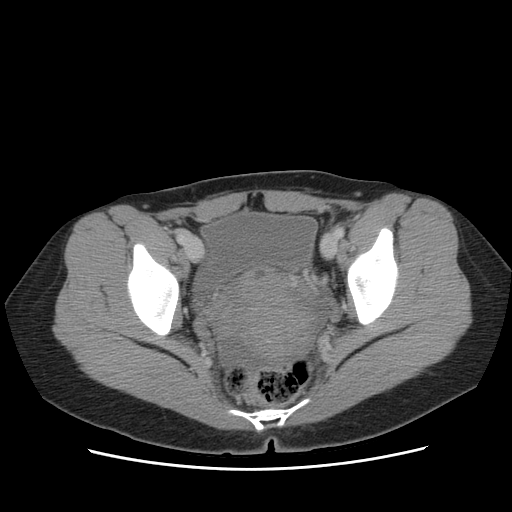
[im 22/84  soft-tissue]
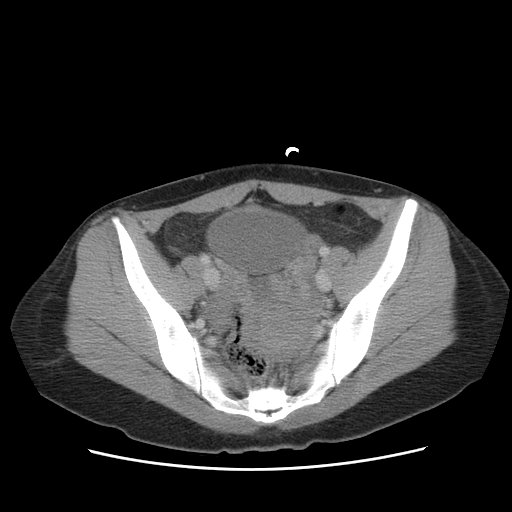
[im 29/84  soft-tissue]
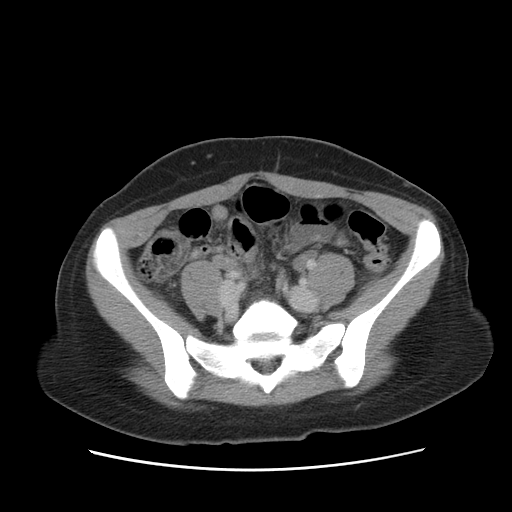
[im 33/84  soft-tissue]
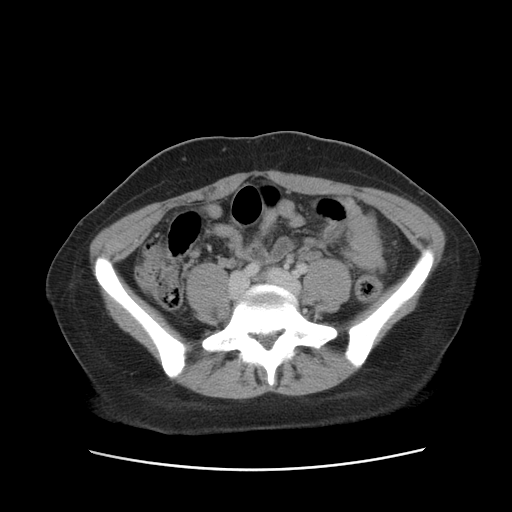
[im 40/84  soft-tissue]
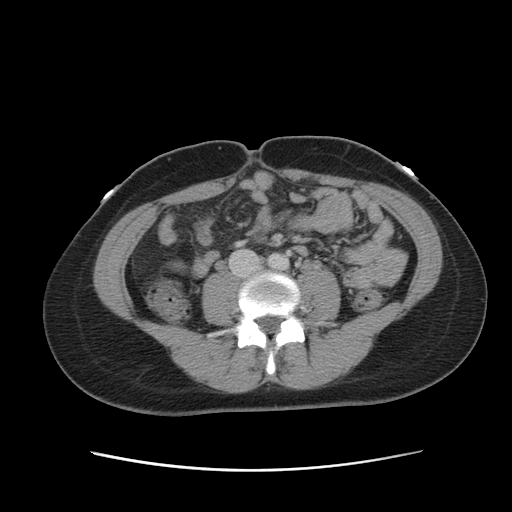
[im 44/84  soft-tissue]
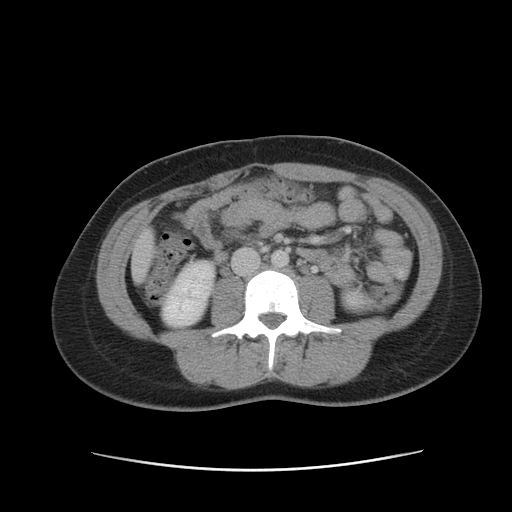
[im 51/84  soft-tissue]
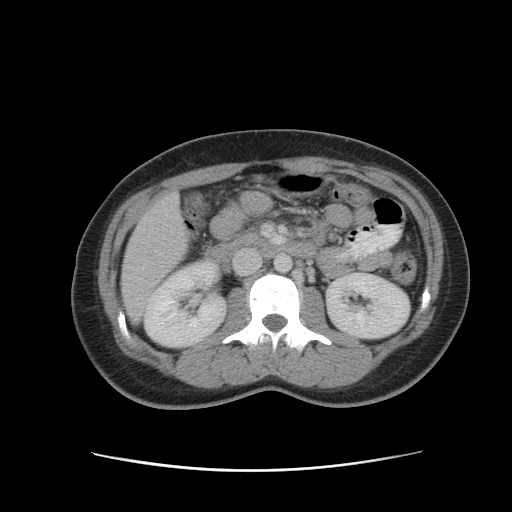
[im 51/84  bone]
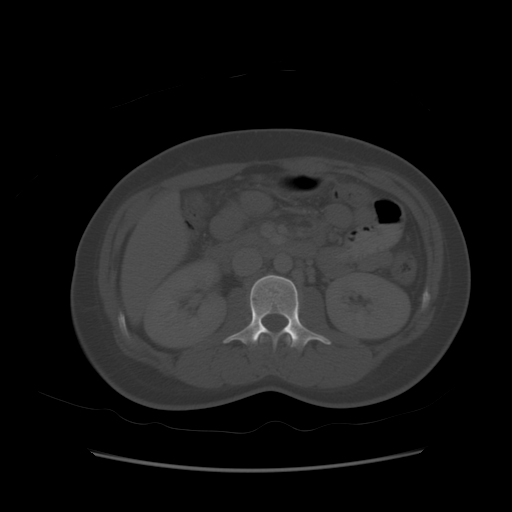
[im 55/84  soft-tissue]
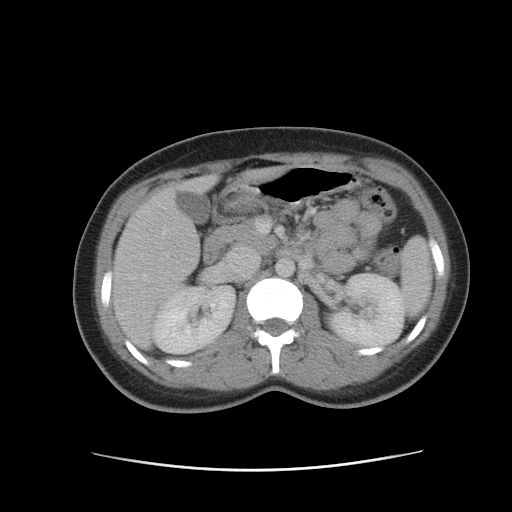
[im 62/84  soft-tissue]
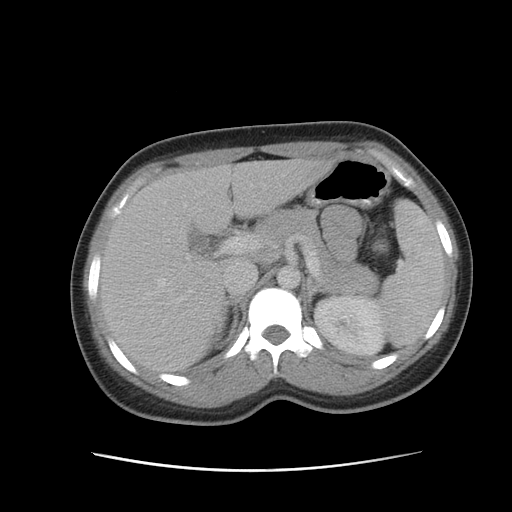
[im 69/84  soft-tissue]
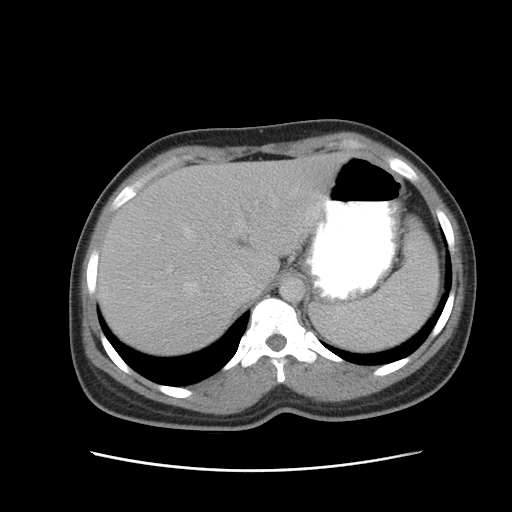
[im 73/84  soft-tissue]
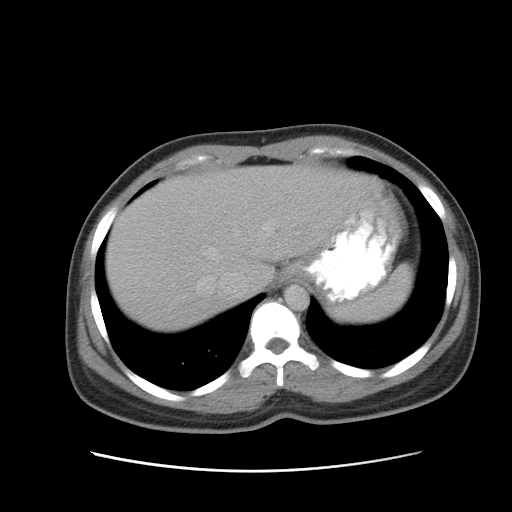
[im 80/84  soft-tissue]
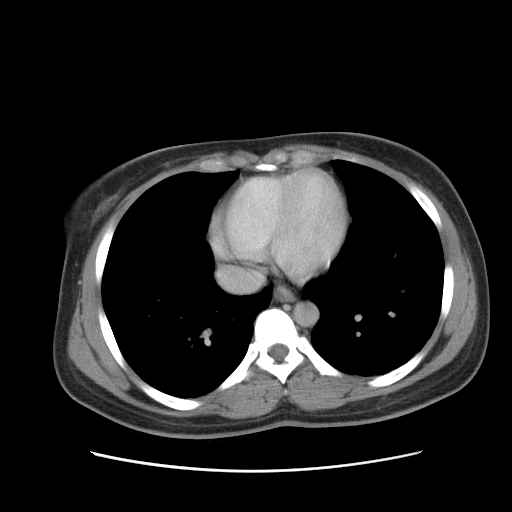

[Series 401: cor · coronal · 0.89mm/px · 3 of 79 slices shown]
[im 27/79  soft-tissue]
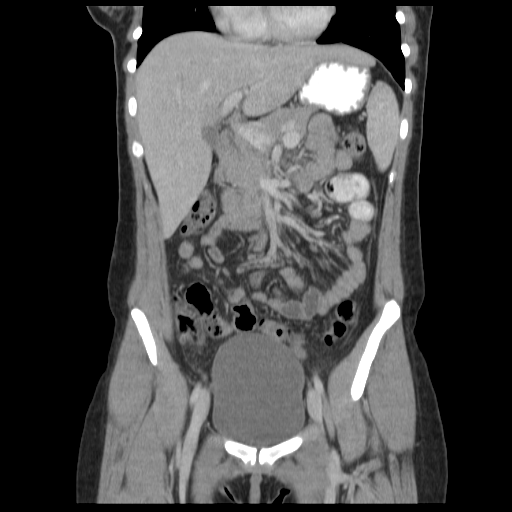
[im 35/79  soft-tissue]
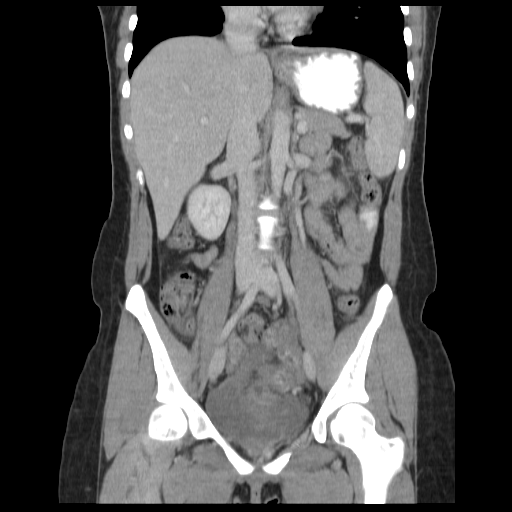
[im 44/79  soft-tissue]
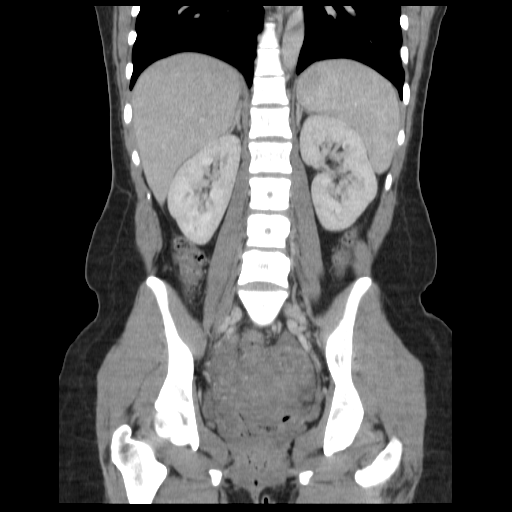

[17 of 46 positions shown; findings below may reference images not displayed]

FINDINGS: The visualized lung bases are clear.

The liver and spleen are unremarkable in appearance.  The
gallbladder is within normal limits.  The pancreas and adrenal
glands are unremarkable.  The kidneys are within normal limits
bilaterally; no hydronephrosis or perinephric stranding is seen.

The small bowel is unremarkable in appearance.  The stomach is
within normal limits.  No acute vascular abnormalities are seen.

The appendix remains normal in size, measuring 6 mm, and contains
trace air.  This suggests against acute appendicitis.  Mild
residual soft tissue stranding is noted at both lower quadrants;
this appears slightly improved from the prior study.  Apparent mild
inflammation of the cecum is much less prominent than on the prior
study, and may be within normal limits.  The colon is otherwise
unremarkable in appearance.

The bladder is mildly distended and is grossly unremarkable in
appearance.  The uterus is within normal limits.  The ovaries are
relatively symmetric; no suspicious adnexal masses are seen.  No
inguinal lymphadenopathy is seen.

A small amount of free fluid within the pelvis is likely
physiologic in nature.

No acute osseous abnormalities are identified.
IMPRESSION: 1.  No definite acute abnormalities identified within the abdomen
or pelvis.
2.  Mild residual soft tissue stranding at both lower quadrants is
slightly improved from 1113, and likely reflects minimal chronic
inflammation.

## 2014-10-30 ENCOUNTER — Encounter (HOSPITAL_COMMUNITY): Payer: Self-pay | Admitting: Obstetrics and Gynecology

## 2014-11-10 ENCOUNTER — Emergency Department (HOSPITAL_COMMUNITY)
Admission: EM | Admit: 2014-11-10 | Discharge: 2014-11-10 | Disposition: A | Discharge status: Home / Self Care | Payer: Medicaid Other | Attending: Emergency Medicine | Admitting: Emergency Medicine

## 2014-11-10 ENCOUNTER — Emergency Department (HOSPITAL_COMMUNITY): Payer: Medicaid Other

## 2014-11-10 ENCOUNTER — Encounter (HOSPITAL_COMMUNITY): Payer: Self-pay | Admitting: Emergency Medicine

## 2014-11-10 DIAGNOSIS — R109 Unspecified abdominal pain: Secondary | ICD-10-CM

## 2014-11-10 DIAGNOSIS — R1011 Right upper quadrant pain: Secondary | ICD-10-CM | POA: Diagnosis not present

## 2014-11-10 DIAGNOSIS — Z8679 Personal history of other diseases of the circulatory system: Secondary | ICD-10-CM | POA: Diagnosis not present

## 2014-11-10 DIAGNOSIS — M546 Pain in thoracic spine: Secondary | ICD-10-CM | POA: Insufficient documentation

## 2014-11-10 DIAGNOSIS — R1013 Epigastric pain: Secondary | ICD-10-CM | POA: Diagnosis not present

## 2014-11-10 DIAGNOSIS — R10811 Right upper quadrant abdominal tenderness: Secondary | ICD-10-CM

## 2014-11-10 DIAGNOSIS — Z79899 Other long term (current) drug therapy: Secondary | ICD-10-CM | POA: Insufficient documentation

## 2014-11-10 DIAGNOSIS — Z9889 Other specified postprocedural states: Secondary | ICD-10-CM | POA: Insufficient documentation

## 2014-11-10 DIAGNOSIS — Z8719 Personal history of other diseases of the digestive system: Secondary | ICD-10-CM | POA: Diagnosis not present

## 2014-11-10 HISTORY — DX: Noninfective gastroenteritis and colitis, unspecified: K52.9

## 2014-11-10 LAB — URINALYSIS, ROUTINE W REFLEX MICROSCOPIC
Bilirubin Urine: NEGATIVE
GLUCOSE, UA: NEGATIVE mg/dL
Ketones, ur: NEGATIVE mg/dL
LEUKOCYTES UA: NEGATIVE
NITRITE: NEGATIVE
PH: 5.5 (ref 5.0–8.0)
Protein, ur: NEGATIVE mg/dL
SPECIFIC GRAVITY, URINE: 1.019 (ref 1.005–1.030)
Urobilinogen, UA: 0.2 mg/dL (ref 0.0–1.0)

## 2014-11-10 LAB — LIPASE, BLOOD: LIPASE: 28 U/L (ref 11–59)

## 2014-11-10 LAB — COMPREHENSIVE METABOLIC PANEL
ALBUMIN: 3.6 g/dL (ref 3.5–5.2)
ALT: 16 U/L (ref 0–35)
AST: 23 U/L (ref 0–37)
Alkaline Phosphatase: 41 U/L (ref 39–117)
Anion gap: 15 (ref 5–15)
BUN: 12 mg/dL (ref 6–23)
CALCIUM: 9.3 mg/dL (ref 8.4–10.5)
CO2: 23 meq/L (ref 19–32)
CREATININE: 0.55 mg/dL (ref 0.50–1.10)
Chloride: 101 mEq/L (ref 96–112)
GFR calc Af Amer: >90 mL/min (ref >90)
Glucose, Bld: 105 mg/dL — ABNORMAL HIGH (ref 70–99)
Potassium: 3.8 mEq/L (ref 3.7–5.3)
SODIUM: 139 meq/L (ref 137–147)
TOTAL PROTEIN: 7.5 g/dL (ref 6.0–8.3)
Total Bilirubin: 0.4 mg/dL (ref 0.3–1.2)

## 2014-11-10 LAB — CBC WITH DIFFERENTIAL/PLATELET
BASOS ABS: 0 10*3/uL (ref 0.0–0.1)
BASOS PCT: 0 % (ref 0–1)
EOS PCT: 6 % — AB (ref 0–5)
Eosinophils Absolute: 0.4 10*3/uL (ref 0.0–0.7)
HCT: 40.8 % (ref 36.0–46.0)
Hemoglobin: 13.6 g/dL (ref 12.0–15.0)
LYMPHS PCT: 19 % (ref 12–46)
Lymphs Abs: 1.3 10*3/uL (ref 0.7–4.0)
MCH: 27.4 pg (ref 26.0–34.0)
MCHC: 33.3 g/dL (ref 30.0–36.0)
MCV: 82.1 fL (ref 78.0–100.0)
Monocytes Absolute: 0.5 10*3/uL (ref 0.1–1.0)
Monocytes Relative: 7 % (ref 3–12)
NEUTROS ABS: 4.6 10*3/uL (ref 1.7–7.7)
Neutrophils Relative %: 68 % (ref 43–77)
PLATELETS: 291 10*3/uL (ref 150–400)
RBC: 4.97 MIL/uL (ref 3.87–5.11)
RDW: 13.2 % (ref 11.5–15.5)
WBC: 6.8 10*3/uL (ref 4.0–10.5)

## 2014-11-10 LAB — URINE MICROSCOPIC-ADD ON

## 2014-11-10 MED ORDER — FENTANYL CITRATE 0.05 MG/ML IJ SOLN
25.0000 ug | Freq: Once | INTRAMUSCULAR | Status: AC
  Administered 2014-11-10: 25 ug via INTRAVENOUS
  Filled 2014-11-10: qty 2

## 2014-11-10 MED ORDER — ONDANSETRON HCL 4 MG/2ML IJ SOLN
4.0000 mg | Freq: Once | INTRAMUSCULAR | Status: AC
  Administered 2014-11-10: 4 mg via INTRAVENOUS
  Filled 2014-11-10: qty 2

## 2014-11-10 MED ORDER — ONDANSETRON 4 MG PO TBDP: 4.0000 mg | ORAL_TABLET | Freq: Three times a day (TID) | ORAL | Status: AC | PRN

## 2014-11-10 MED ORDER — MORPHINE SULFATE 4 MG/ML IJ SOLN
4.0000 mg | Freq: Once | INTRAMUSCULAR | Status: AC
  Administered 2014-11-10: 4 mg via INTRAVENOUS
  Filled 2014-11-10: qty 1

## 2014-11-10 MED ORDER — TRAMADOL HCL 50 MG PO TABS: 100.0000 mg | ORAL_TABLET | Freq: Four times a day (QID) | ORAL | Status: AC | PRN

## 2014-11-10 MED ORDER — SODIUM CHLORIDE 0.9 % IV SOLN
INTRAVENOUS | Status: DC
  Administered 2014-11-10: 08:00:00 via INTRAVENOUS

## 2014-11-10 MED ORDER — FAMOTIDINE IN NACL 20-0.9 MG/50ML-% IV SOLN
20.0000 mg | Freq: Once | INTRAVENOUS | Status: AC
  Administered 2014-11-10: 20 mg via INTRAVENOUS
  Filled 2014-11-10: qty 50

## 2014-11-10 MED ORDER — SODIUM CHLORIDE 0.9 % IV BOLUS (SEPSIS)
1000.0000 mL | Freq: Once | INTRAVENOUS | Status: AC
  Administered 2014-11-10: 1000 mL via INTRAVENOUS

## 2014-11-10 MED ORDER — OMEPRAZOLE 20 MG PO CPDR: DELAYED_RELEASE_CAPSULE | ORAL | Status: AC

## 2014-11-10 NOTE — Discharge Instructions (Signed)
Your tests today were all normal. Drink plenty of fluids. Take the medications as prescribed. Recheck if your symptoms get worse again despite taking the medications.

## 2014-11-10 NOTE — ED Notes (Signed)
Pt from home with c/o epigastric pain that radiates left around to her back and left to her axilla waking her from sleep at 540 this am.  Pt reports having a hx of the same thing with dx of colitis.  Denies N/V/D. Pt in NAD, A&O.

## 2014-11-10 NOTE — ED Provider Notes (Signed)
CSN: [id number]     Arrival date & time 11/10/14  [id number] History   First MD Initiated Contact with Patient 11/10/14 0732     Chief Complaint  Patient presents with  . Abdominal Pain     (Consider location/radiation/quality/duration/timing/severity/associated sxs/prior Treatment) HPI   Pt has had 2 children, relates she uses a birth control patch that she removes every 1-2 months to have a period. She states she removed her patch 5 days ago and she started having her menses yesterday. She states her menses is normal. She states at 5 AM she was awakened with a sharp stabbing epigastric pain that radiates into her left side/flank/back. I'll has a constant tight squeezing discomfort that radiates into her back. It does not radiate into her lower abdomen. She denies nausea, vomiting, or diarrhea. She states she had a bowel movement without relief of pain. She states she's never had this pain before. She states standing up straight makes the pain worse, bending over and putting pressure on the area makes it feel a little bit better. She denies any urinary symptoms such as dysuria, frequency, or prior hematuria. She denies any prior discomfort with eating. She denies a feeling of bloating.she denies any change in of her activity or food.  PC[place] Dr Lorelee [place] in [place] [facility] [place]   Past Medical History  Diagnosis Date  . Headache(784.0)     migraines  . Colitis    Past Surgical History  Procedure Laterality Date  . Cesarean section    . Wisdom tooth extraction     Family History  Problem Relation Age of Onset  . Urolithiasis Mother    History  Substance Use Topics  . Smoking status: Never Smoker   . Smokeless tobacco: Never Used  . Alcohol Use: No   employed  OB History    Gravida Para Term Preterm AB TAB SAB Ectopic Multiple Living   2 2 2  0 0 0 0 0 0 2     Review of Systems  All other systems reviewed and are negative.     Allergies  Review of patient's allergies indicates  no known allergies.  Home Medications   Prior to Admission medications   Medication Sig Start Date End Date Taking? Authorizing Provider  Ferrous Sulfate 300 (60 FE) MG TABS Take 300 mg by mouth 2 (two) times daily. 10/09/12   Bing Plumehomas F Henley, MD  ibuprofen (ADVIL,MOTRIN) 600 MG tablet Take 1 tablet (600 mg total) by mouth every 6 (six) hours as needed for pain. 10/09/12   Bing Plumehomas F Henley, MD  oxyCODONE-acetaminophen (PERCOCET/ROXICET) 5-325 MG per tablet Take 1-2 tablets by mouth every 6 (six) hours as needed (moderate - severe pain). 10/09/12   Bing Plumehomas F Henley, MD  Prenatal Vit-Fe Fumarate-FA (PRENATAL MULTIVITAMIN) TABS Take 1 tablet by mouth daily.    Historical Provider, MD  Burr MedicoXULANE 150-35 MCG/24HR transdermal patch Place 1 patch onto the skin every 7 (seven) days. 11/07/14   Historical Provider, MD   BP 115/86 mmHg  Pulse 76  Temp(Src) 97.8 F (36.6 C) (Oral)  Resp 22  SpO2 100%  LMP 11/09/2014  Vital signs normal   Physical Exam  Constitutional: She is oriented to person, place, and time. She appears well-developed and well-nourished.  Non-toxic appearance. She does not appear ill. No distress.  HENT:  Head: Normocephalic and atraumatic.  Right Ear: External ear normal.  Left Ear: External ear normal.  Nose: Nose normal. No mucosal edema or rhinorrhea.  Mouth/Throat: Mucous membranes  are normal. No dental abscesses or uvula swelling.  Tongue dry  Eyes: Conjunctivae and EOM are normal. Pupils are equal, round, and reactive to light.  Neck: Normal range of motion and full passive range of motion without pain. Neck supple.  Cardiovascular: Normal rate, regular rhythm and normal heart sounds.  Exam reveals no gallop and no friction rub.   No murmur heard. Pulmonary/Chest: Effort normal and breath sounds normal. No respiratory distress. She has no wheezes. She has no rhonchi. She has no rales. She exhibits no tenderness and no crepitus.  Abdominal: Soft. Normal appearance and  bowel sounds are normal. She exhibits no distension. There is tenderness. There is no rebound and no guarding.  Palpation of her abdomen anywhere causes pain in her epigastric area. Despite her holding her epigastric area and stating that's where her worst pain is located, her worst pain to palpation in her upper abdomen on exam was in her right upper quadrant.  Genitourinary:  Patient has mild left CVA tenderness however she states her worst pain is actually in her lower thoracic spine region.  Musculoskeletal: Normal range of motion. She exhibits no edema or tenderness.  Moves all extremities well.   Neurological: She is alert and oriented to person, place, and time. She has normal strength. No cranial nerve deficit.  Skin: Skin is warm, dry and intact. No rash noted. No erythema. No pallor.  Psychiatric: She has a normal mood and affect. Her speech is normal and behavior is normal. Her mood appears not anxious.  Nursing note and vitals reviewed.   ED Course  Procedures (including critical care time)  Medications  0.9 %  sodium chloride infusion ( Intravenous New Bag/Given 11/10/14 0801)  ondansetron (ZOFRAN) injection 4 mg (4 mg Intravenous Given 11/10/14 0835)  morphine 4 MG/ML injection 4 mg (4 mg Intravenous Given 11/10/14 0837)  famotidine (PEPCID) IVPB 20 mg (20 mg Intravenous New Bag/Given 11/10/14 1245)  fentaNYL (SUBLIMAZE) injection 25 mcg (25 mcg Intravenous Given 11/10/14 1240)  sodium chloride 0.9 % bolus 1,000 mL (1,000 mLs Intravenous New Bag/Given 11/10/14 1245)   Patient states she normally gets cramps of her period. Her period did start yesterday. However she states that is mainly in her lower abdomen. Patient will be evaluated to make sure she does not havegallstones.  11:30 patient and her husband were given her test results. She continues to have some discomfort. She also reports she has not had any urinary output. She was given more medications.  Recheck at 1340  patient's pain is gone at this time although her abdomen is sore to touch. She has had no more vomiting. She is finishing her fluids. She feels ready to be discharged.  Labs Review  Results for orders placed or performed during the hospital encounter of 11/10/14  CBC with Differential  Result Value Ref Range   WBC 6.8 4.0 - 10.5 K/uL   RBC 4.97 3.87 - 5.11 MIL/uL   Hemoglobin 13.6 12.0 - 15.0 g/dL   HCT 16.1 09.6 - 04.5 %   MCV 82.1 78.0 - 100.0 fL   MCH 27.4 26.0 - 34.0 pg   MCHC 33.3 30.0 - 36.0 g/dL   RDW 40.9 81.1 - 91.4 %   Platelets 291 150 - 400 K/uL   Neutrophils Relative % 68 43 - 77 %   Neutro Abs 4.6 1.7 - 7.7 K/uL   Lymphocytes Relative 19 12 - 46 %   Lymphs Abs 1.3 0.7 - 4.0 K/uL   Monocytes  Relative 7 3 - 12 %   Monocytes Absolute 0.5 0.1 - 1.0 K/uL   Eosinophils Relative 6 (H) 0 - 5 %   Eosinophils Absolute 0.4 0.0 - 0.7 K/uL   Basophils Relative 0 0 - 1 %   Basophils Absolute 0.0 0.0 - 0.1 K/uL  Comprehensive metabolic panel  Result Value Ref Range   Sodium 139 137 - 147 mEq/L   Potassium 3.8 3.7 - 5.3 mEq/L   Chloride 101 96 - 112 mEq/L   CO2 23 19 - 32 mEq/L   Glucose, Bld 105 (H) 70 - 99 mg/dL   BUN 12 6 - 23 mg/dL   Creatinine, Ser 4.09 0.50 - 1.10 mg/dL   Calcium 9.3 8.4 - 81.1 mg/dL   Total Protein 7.5 6.0 - 8.3 g/dL   Albumin 3.6 3.5 - 5.2 g/dL   AST 23 0 - 37 U/L   ALT 16 0 - 35 U/L   Alkaline Phosphatase 41 39 - 117 U/L   Total Bilirubin 0.4 0.3 - 1.2 mg/dL   GFR calc non Af Amer >90 >90 mL/min   GFR calc Af Amer >90 >90 mL/min   Anion gap 15 5 - 15  Lipase, blood  Result Value Ref Range   Lipase 28 11 - 59 U/L  Urinalysis, Routine w reflex microscopic  Result Value Ref Range   Color, Urine YELLOW YELLOW   APPearance CLEAR CLEAR   Specific Gravity, Urine 1.019 1.005 - 1.030   pH 5.5 5.0 - 8.0   Glucose, UA NEGATIVE NEGATIVE mg/dL   Hgb urine dipstick LARGE (A) NEGATIVE   Bilirubin Urine NEGATIVE NEGATIVE   Ketones, ur NEGATIVE NEGATIVE  mg/dL   Protein, ur NEGATIVE NEGATIVE mg/dL   Urobilinogen, UA 0.2 0.0 - 1.0 mg/dL   Nitrite NEGATIVE NEGATIVE   Leukocytes, UA NEGATIVE NEGATIVE  Urine microscopic-add on  Result Value Ref Range   Squamous Epithelial / LPF RARE RARE   RBC / HPF TOO NUMEROUS TO COUNT <3 RBC/hpf   Bacteria, UA FEW (A) RARE     Laboratory interpretation all normal except hematuria (voided urine while having menses)   Imaging Review US Abdomen Complete  11/10/2014   CLINICAL DATA:  Acute abdominal pain in the left flank and right upper quadrant.  EXAM: ULTRASOUND ABDOMEN COMPLETE  COMPARISON:  08/20/2011 CT abdomen  FINDINGS: Gallbladder: No gallstones or wall thickening visualized. No sonographic Murphy sign noted.  Common bile duct: Diameter: 5 - 6 mm. Although this is borderline dilated, noted normal liver function tests.  Liver: No focal lesion identified. Within normal limits in parenchymal echogenicity.  IVC: No abnormality visualized.  Pancreas: Visualized portion unremarkable.  Spleen: Size and appearance within normal limits.  Right Kidney: Length: 11 cm. Echogenicity within normal limits. No mass or hydronephrosis visualized.  Left Kidney: Length: 12 cm. Echogenicity within normal limits. No mass or hydronephrosis visualized.  Abdominal aorta: No aneurysm visualized.  Other findings: None.  IMPRESSION: Negative abdominal ultrasound, as above.   Electronically Signed   By: Tiburcio Pea M.D.   On: 11/10/2014 10:32      EKG Interpretation   Date/Time:  Friday November 10 2014 07:43:14 EST Ventricular Rate:  61 PR Interval:  126 QRS Duration: 86 QT Interval:  406 QTC Calculation: 409 R Axis:   68 Text Interpretation:  Sinus rhythm Baseline wander in lead(s) V5 V6  Otherwise within normal limits Since last tracing rate slower (24 Jan 2008) Confirmed by [facility]  MD-I, Jasmine Maceachern ([id number])  on 11/10/2014 7:50:55 AM      MDM  Patient presents with epigastric pain without acute worrisome cause such as  gallstones. She may just have gastritis. Her chest wall is nontender.   Final diagnoses:  Epigastric abdominal pain  Acute left flank pain  RUQ abdominal tenderness     New Prescriptions   OMEPRAZOLE (PRILOSEC) 20 MG CAPSULE    Take 1 po BID x 2 weeks then once a day   ONDANSETRON (ZOFRAN ODT) 4 MG DISINTEGRATING TABLET    Take 1 tablet (4 mg total) by mouth every 8 (eight) hours as needed for nausea or vomiting.   TRAMADOL (ULTRAM) 50 MG TABLET    Take 2 tablets (100 mg total) by mouth every 6 (six) hours as needed.    Plan discharge  Devoria AlbeIva Kenon Delashmit, MD, [facility] DellFACEP    Enora Trillo L Liat Mayol, MD 11/10/14 [phone]

## 2014-11-10 NOTE — ED Notes (Signed)
Patient transported to Ultrasound 

## 2016-08-21 ENCOUNTER — Ambulatory Visit: Admit: 2016-08-21 | Payer: Medicaid Other | Admitting: Obstetrics and Gynecology

## 2016-08-21 SURGERY — LIGATION, FALLOPIAN TUBE, LAPAROSCOPIC
Anesthesia: Choice | Laterality: Bilateral

## 2016-08-23 IMAGING — US US ABDOMEN COMPLETE
1 series · 14 of 25 positions shown · non-contrast
Comparison: 08/20/2011 CT abdomen

CLINICAL DATA: Acute abdominal pain in the left flank and right
upper quadrant.

EXAM:
ULTRASOUND ABDOMEN COMPLETE

[Series 1: us abdomen complete · 0.24mm/px · 14 of 47 slices shown]
[im 1/47]
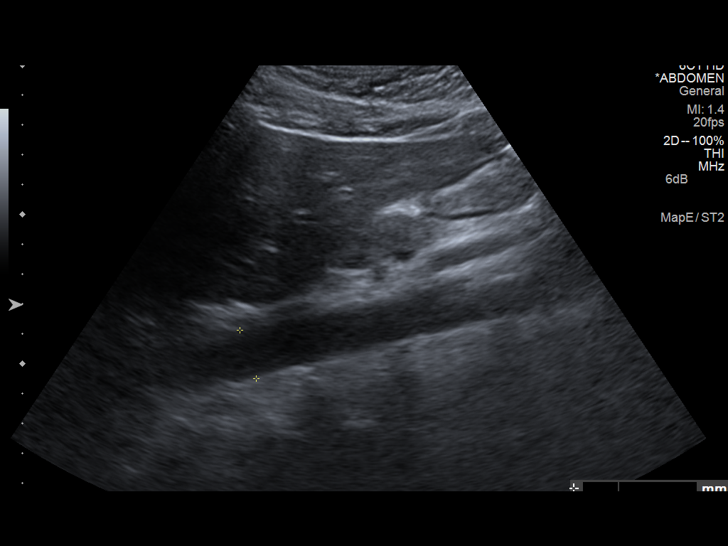
[im 4/47]
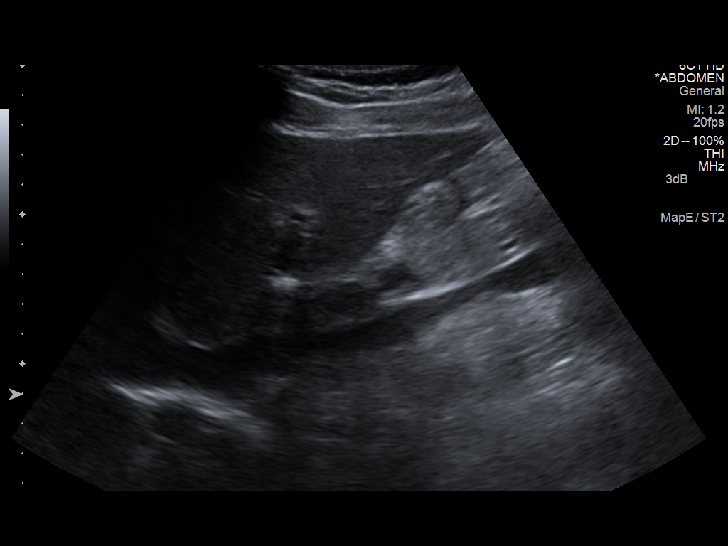
[im 8/47]
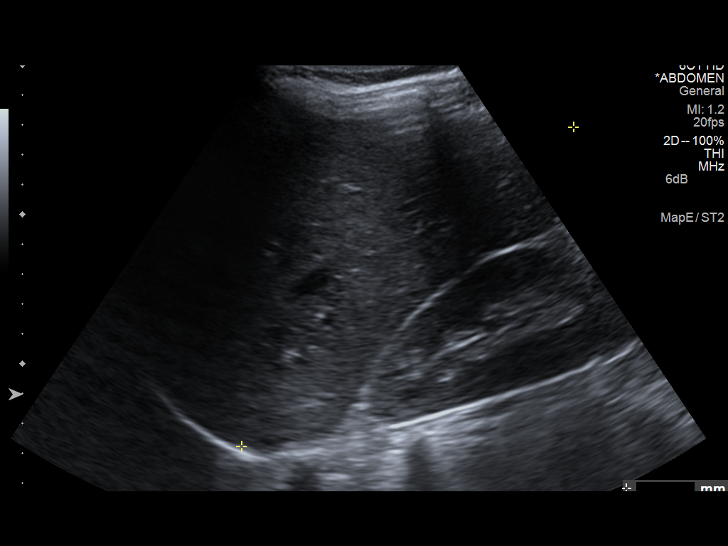
[im 12/47]
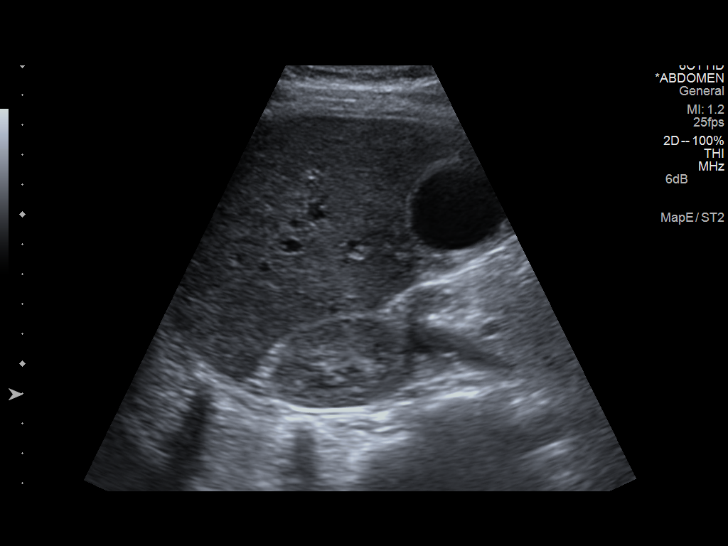
[im 16/47]
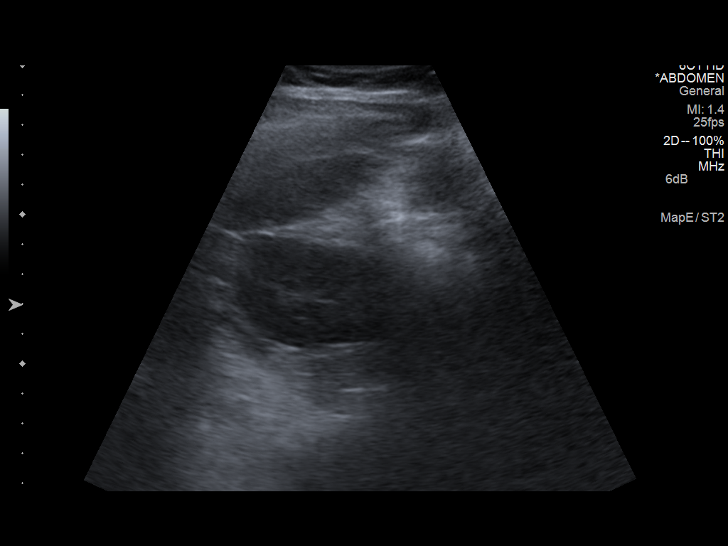
[im 18/47]
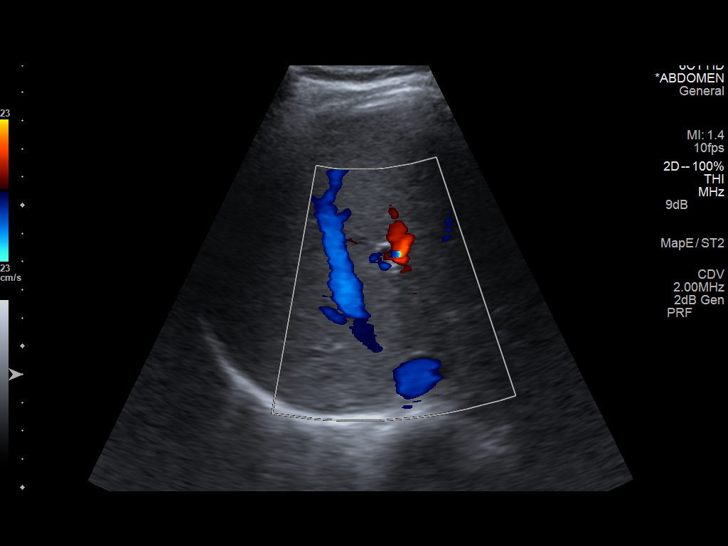
[im 22/47]
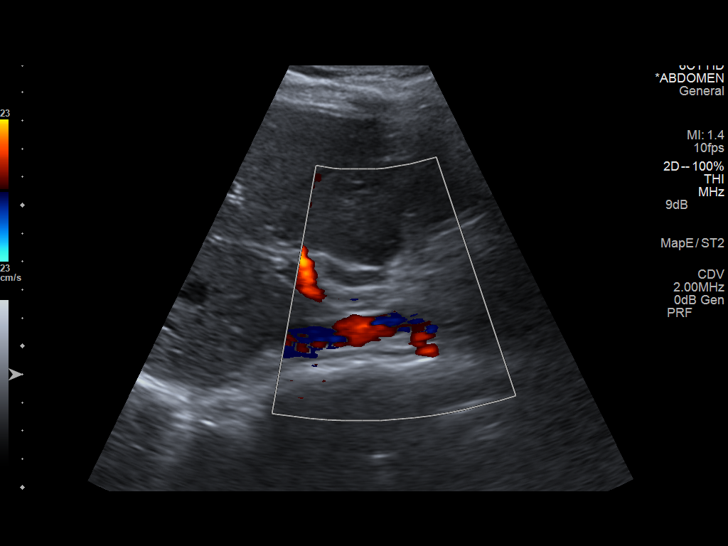
[im 25/47]
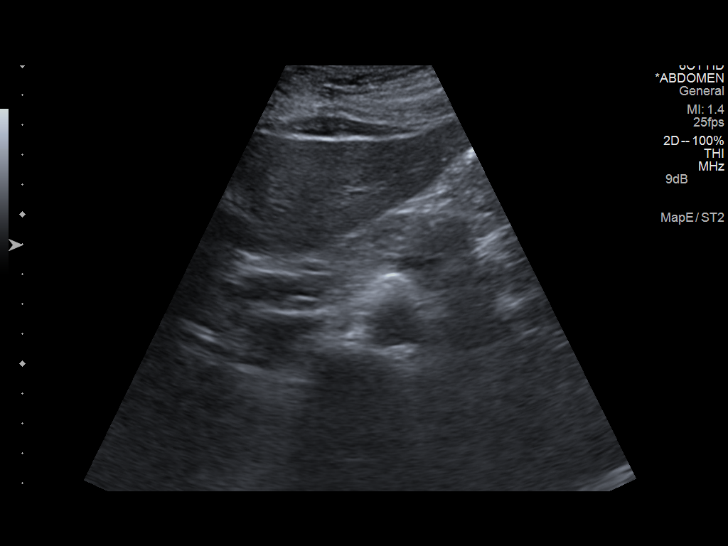
[im 29/47]
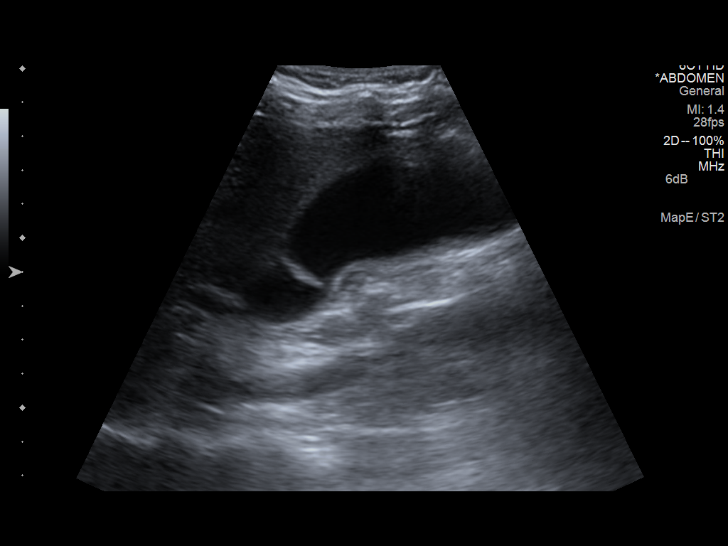
[im 31/47]
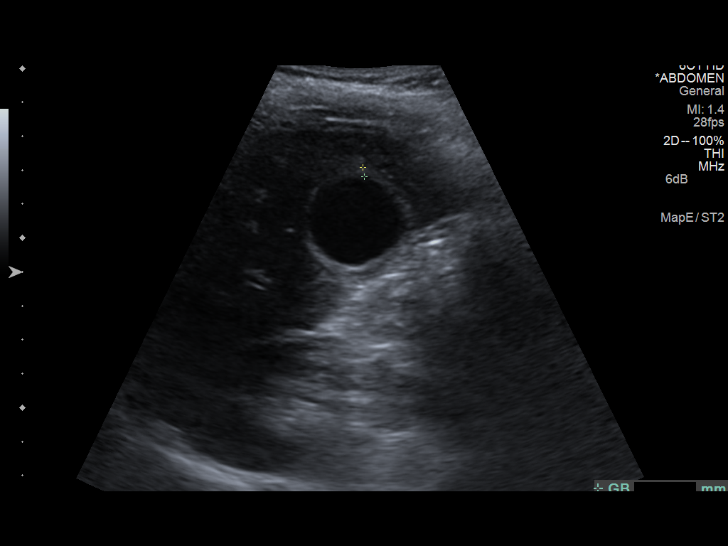
[im 35/47]
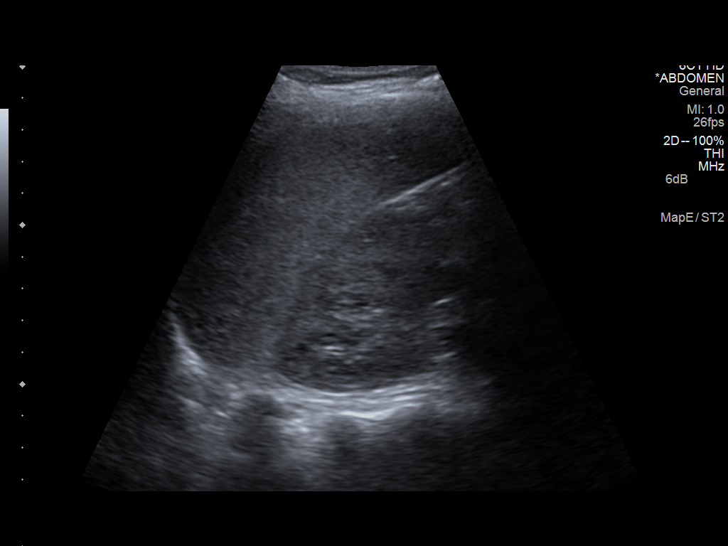
[im 39/47]
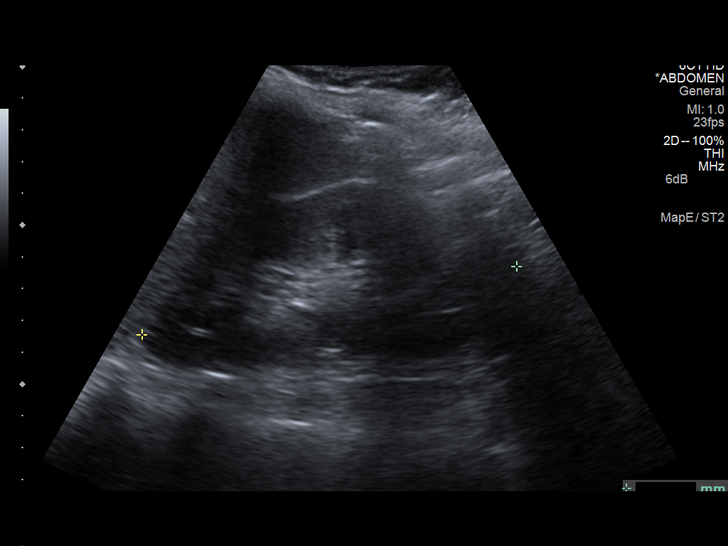
[im 43/47]
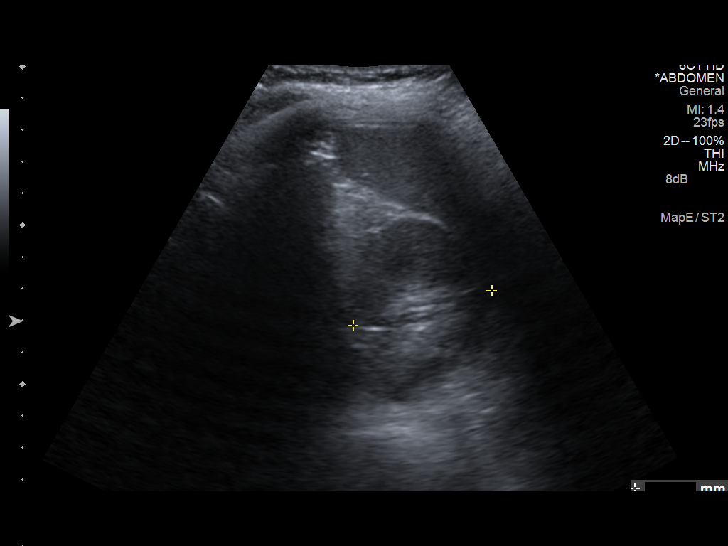
[im 47/47]
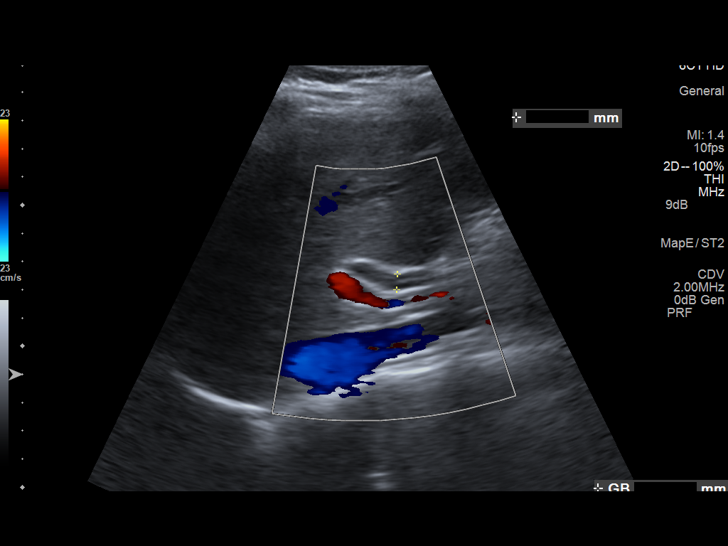

[14 of 25 positions shown; findings below may reference images not displayed]

FINDINGS: Gallbladder: No gallstones or wall thickening visualized. No
sonographic Murphy sign noted.

Common bile duct: Diameter: 5 - 6 mm. Although this is borderline
dilated, noted normal liver function tests.

Liver: No focal lesion identified. Within normal limits in
parenchymal echogenicity.

IVC: No abnormality visualized.

Pancreas: Visualized portion unremarkable.

Spleen: Size and appearance within normal limits.

Right Kidney: Length: 11 cm. Echogenicity within normal limits. No
mass or hydronephrosis visualized.

Left Kidney: Length: 12 cm. Echogenicity within normal limits. No
mass or hydronephrosis visualized.

Abdominal aorta: No aneurysm visualized.

Other findings: None.
IMPRESSION: Negative abdominal ultrasound, as above.

## 2021-02-04 IMAGING — US US PELVIS COMPLETE WITH TRANSVAGINAL
1 series · 14 of 25 positions shown · non-contrast
Comparison: None

CLINICAL DATA: Menorrhagia with regular cycle



[Series 1: us pelvic complete with transvaginal · 14 of 100 slices shown]
[im 1/100]
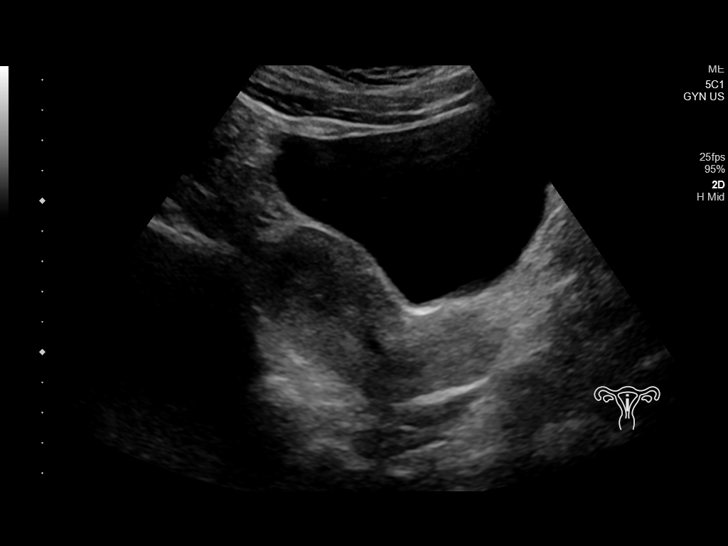
[im 9/100]
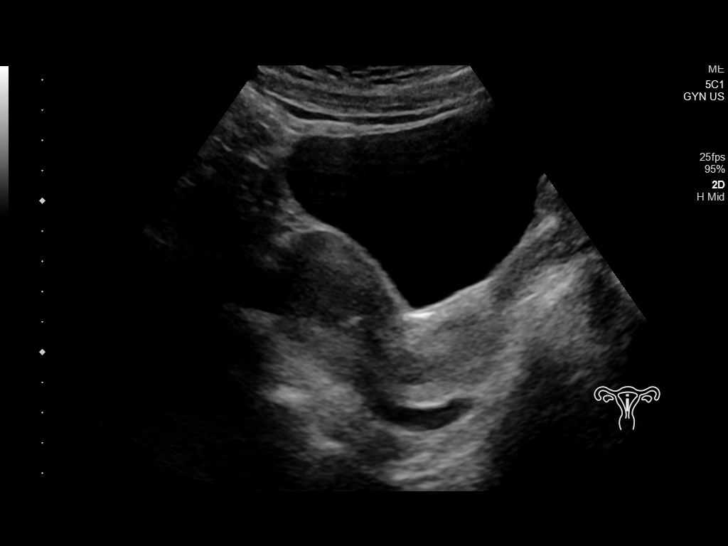
[im 17/100]
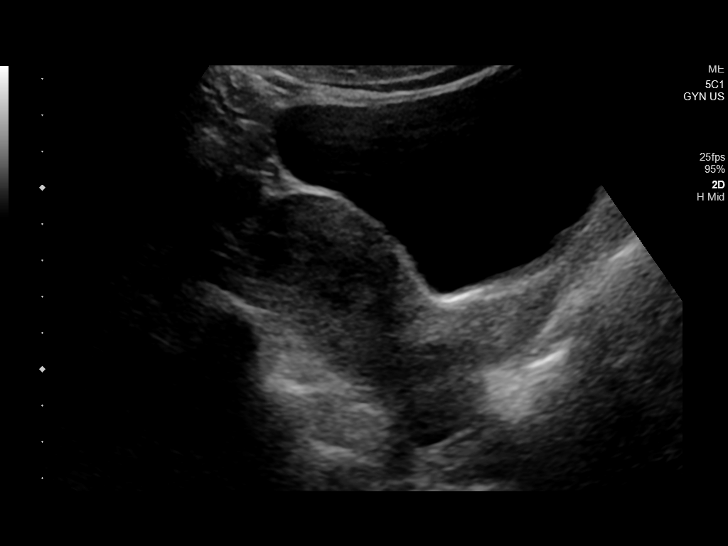
[im 25/100]
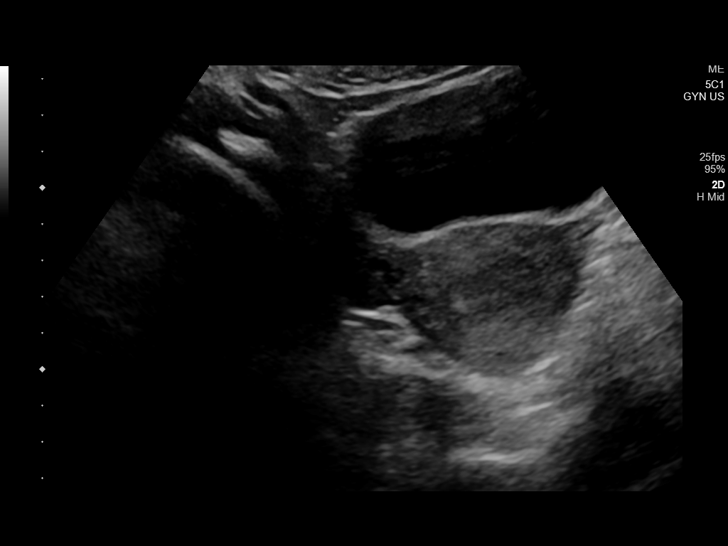
[im 34/100]
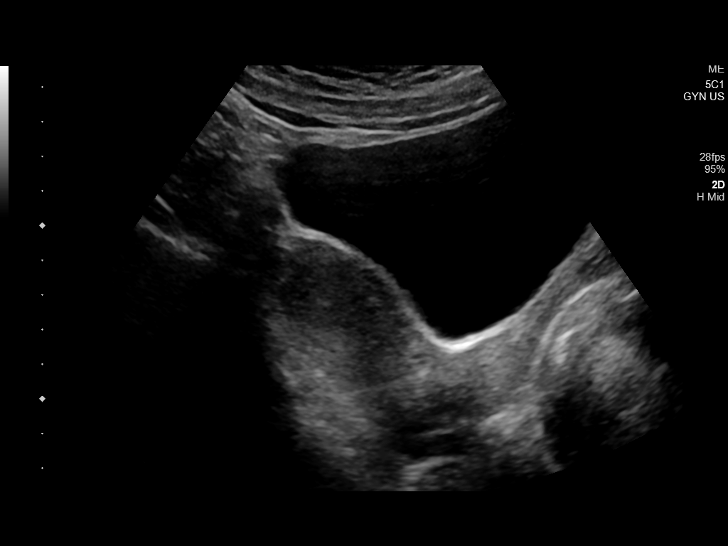
[im 38/100]
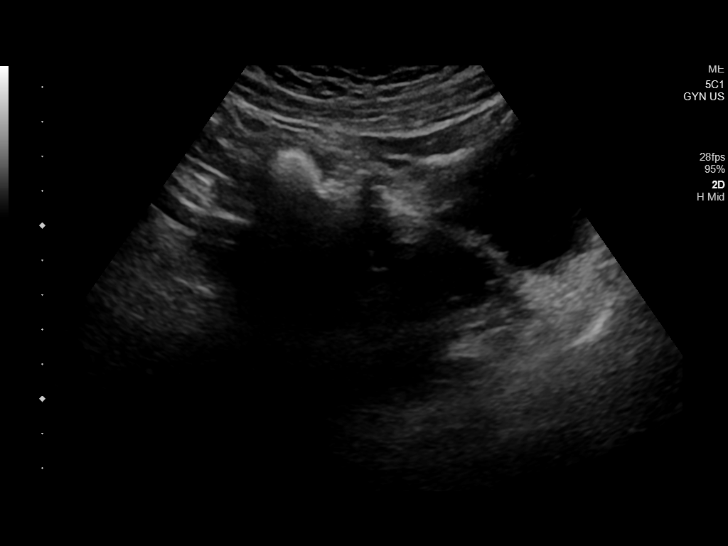
[im 46/100]
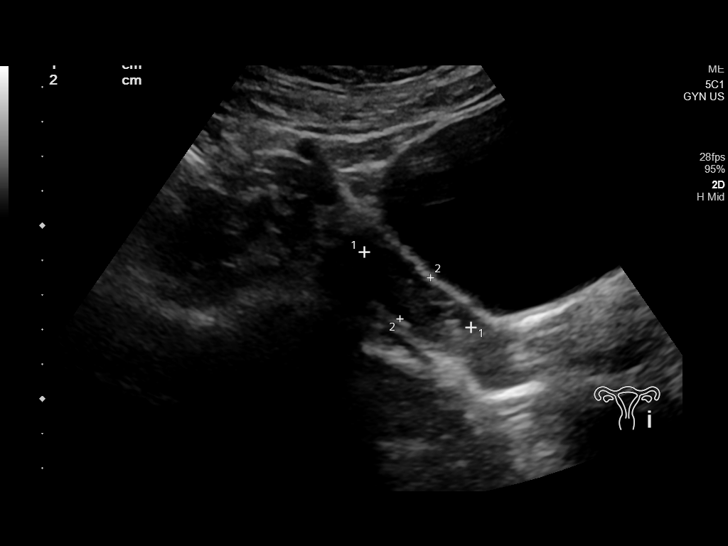
[im 54/100]
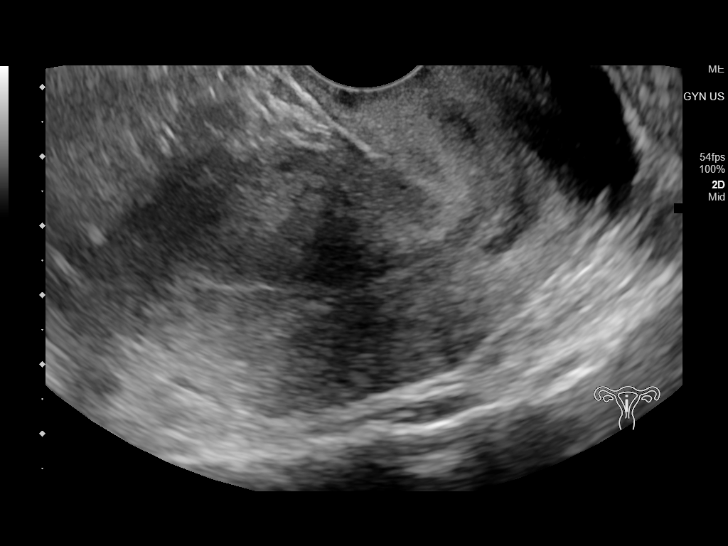
[im 62/100]
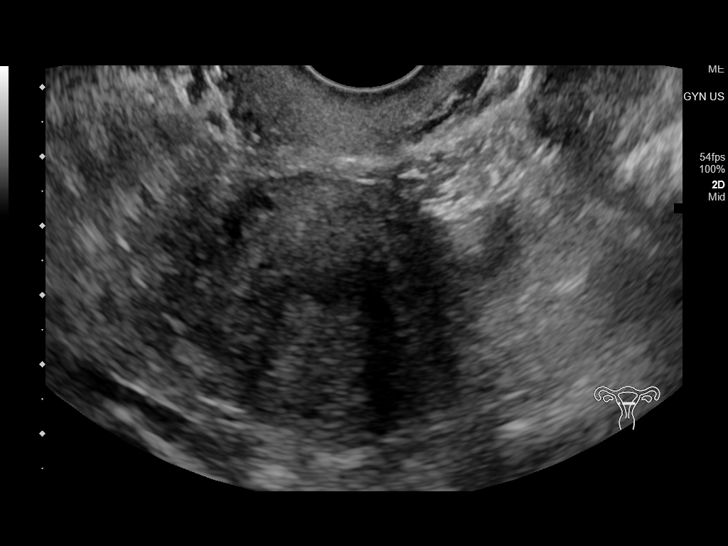
[im 67/100]
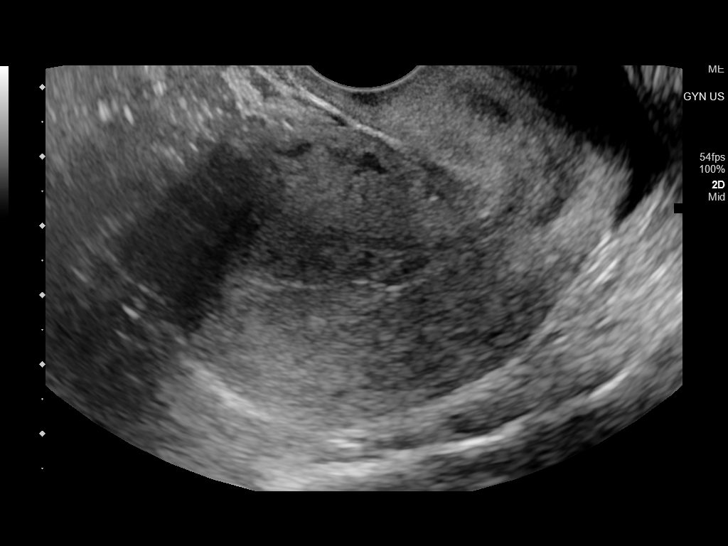
[im 75/100]
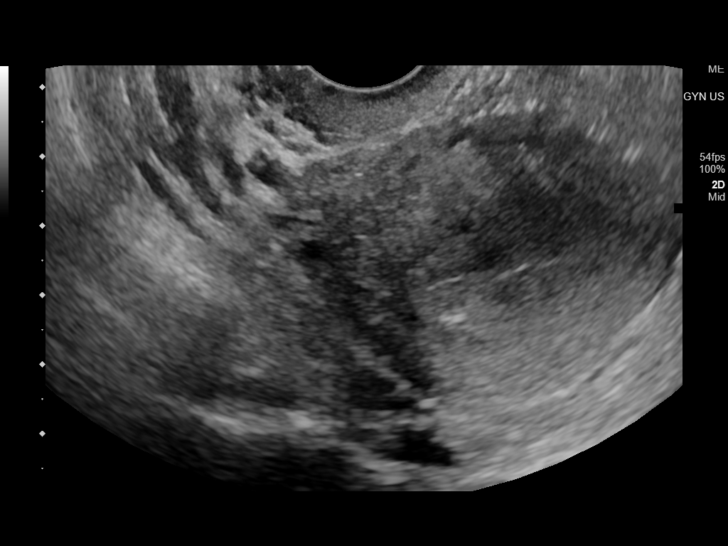
[im 83/100]
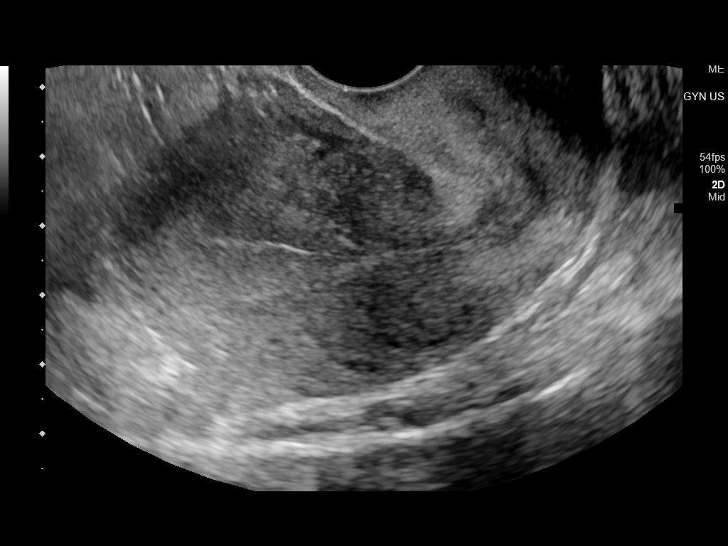
[im 91/100]
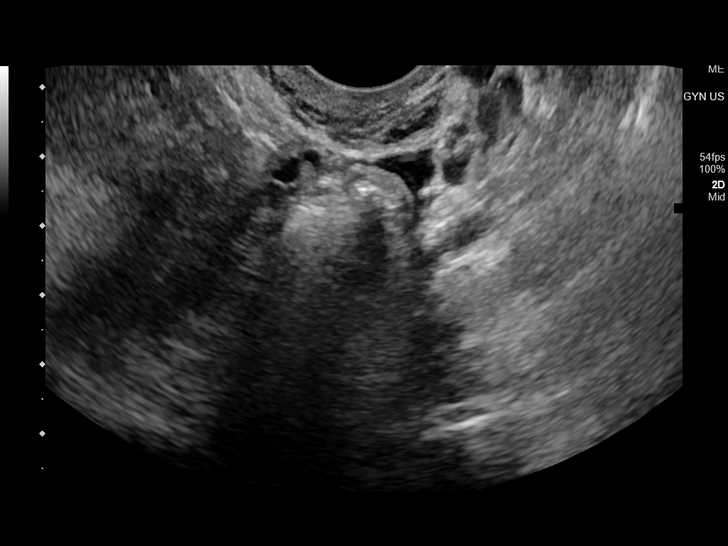
[im 100/100]
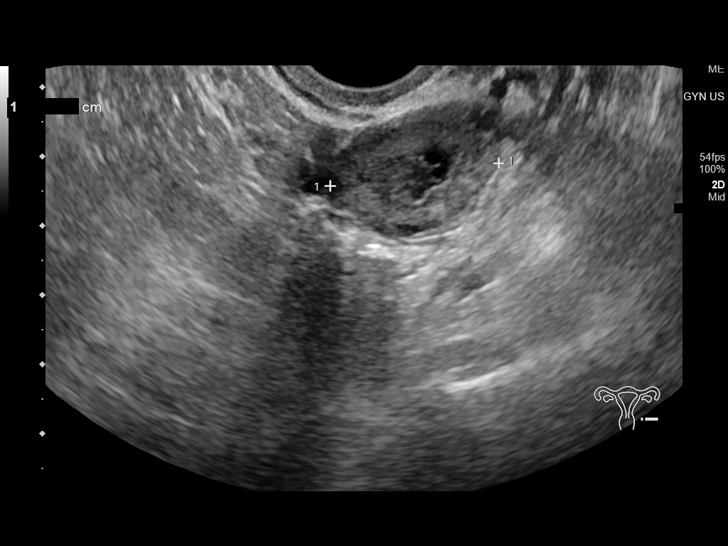

[14 of 25 positions shown; findings below may reference images not displayed]

FINDINGS: Uterus

Measurements: 6.3 x 3.7 x 4.9 cm = volume: 59 mL. Anteflexed. Normal
morphology without mass

Endometrium

Thickness: 15 mm.  No endometrial fluid or focal abnormality

Right ovary

Measurements: 3.0 x 1.8 x 1.9 cm = volume: 5.3 mL. Normal morphology
without mass

Left ovary

Measurements: 4.3 x 1.8 x 2.5 cm = volume: 10.1 mL. Normal
morphology without mass

Other findings

Trace free pelvic fluid potentially physiologic.  No adnexal masses.
IMPRESSION: No pelvic sonographic abnormalities identified.
